# Patient Record
Sex: Female | Born: 1955 | Race: Black or African American | Hispanic: No | Marital: Married | Smoking: Former smoker
Health system: Southern US, Community
[De-identification: ages and names within clinical notes are randomized; demographics above are authoritative.]

## PROBLEM LIST (undated history)

## (undated) DIAGNOSIS — R011 Cardiac murmur, unspecified: Secondary | ICD-10-CM

## (undated) DIAGNOSIS — H269 Unspecified cataract: Secondary | ICD-10-CM

## (undated) DIAGNOSIS — J302 Other seasonal allergic rhinitis: Secondary | ICD-10-CM

## (undated) DIAGNOSIS — E785 Hyperlipidemia, unspecified: Secondary | ICD-10-CM

## (undated) DIAGNOSIS — H35039 Hypertensive retinopathy, unspecified eye: Secondary | ICD-10-CM

## (undated) DIAGNOSIS — G4730 Sleep apnea, unspecified: Secondary | ICD-10-CM

## (undated) DIAGNOSIS — K219 Gastro-esophageal reflux disease without esophagitis: Secondary | ICD-10-CM

## (undated) DIAGNOSIS — I10 Essential (primary) hypertension: Secondary | ICD-10-CM

## (undated) DIAGNOSIS — E119 Type 2 diabetes mellitus without complications: Secondary | ICD-10-CM

## (undated) HISTORY — DX: Type 2 diabetes mellitus without complications: E11.9

## (undated) HISTORY — PX: COLONOSCOPY: SHX174

## (undated) HISTORY — PX: WISDOM TOOTH EXTRACTION: SHX21

## (undated) HISTORY — DX: Hypertensive retinopathy, unspecified eye: H35.039

## (undated) HISTORY — DX: Unspecified cataract: H26.9

## (undated) HISTORY — PX: SHOULDER SURGERY: SHX246

---

## 1999-02-24 ENCOUNTER — Other Ambulatory Visit: Admission: RE | Admit: 1999-02-24 | Discharge: 1999-02-24 | Payer: Self-pay | Admitting: Gynecology

## 1999-07-21 ENCOUNTER — Encounter: Payer: Self-pay | Admitting: Family Medicine

## 1999-07-21 ENCOUNTER — Encounter: Admission: RE | Admit: 1999-07-21 | Discharge: 1999-07-21 | Payer: Self-pay | Admitting: Family Medicine

## 2000-06-18 ENCOUNTER — Encounter: Admission: RE | Admit: 2000-06-18 | Discharge: 2000-06-18 | Payer: Self-pay | Admitting: Family Medicine

## 2000-06-18 ENCOUNTER — Encounter: Payer: Self-pay | Admitting: Family Medicine

## 2000-06-25 ENCOUNTER — Encounter: Admission: RE | Admit: 2000-06-25 | Discharge: 2000-06-25 | Payer: Self-pay | Admitting: Family Medicine

## 2000-06-25 ENCOUNTER — Encounter: Payer: Self-pay | Admitting: Family Medicine

## 2001-06-28 ENCOUNTER — Encounter: Payer: Self-pay | Admitting: Family Medicine

## 2001-06-28 ENCOUNTER — Encounter: Admission: RE | Admit: 2001-06-28 | Discharge: 2001-06-28 | Payer: Self-pay | Admitting: Family Medicine

## 2001-09-06 ENCOUNTER — Other Ambulatory Visit: Admission: RE | Admit: 2001-09-06 | Discharge: 2001-09-06 | Payer: Self-pay | Admitting: Family Medicine

## 2002-07-05 ENCOUNTER — Encounter: Payer: Self-pay | Admitting: Family Medicine

## 2002-07-05 ENCOUNTER — Encounter: Admission: RE | Admit: 2002-07-05 | Discharge: 2002-07-05 | Payer: Self-pay | Admitting: Family Medicine

## 2002-07-17 ENCOUNTER — Encounter: Admission: RE | Admit: 2002-07-17 | Discharge: 2002-07-17 | Payer: Self-pay | Admitting: Family Medicine

## 2002-07-17 ENCOUNTER — Encounter: Payer: Self-pay | Admitting: Family Medicine

## 2003-07-09 ENCOUNTER — Encounter: Admission: RE | Admit: 2003-07-09 | Discharge: 2003-07-09 | Payer: Self-pay | Admitting: Occupational Medicine

## 2004-03-03 ENCOUNTER — Other Ambulatory Visit: Admission: RE | Admit: 2004-03-03 | Discharge: 2004-03-03 | Payer: Self-pay | Admitting: Family Medicine

## 2004-09-26 ENCOUNTER — Encounter: Admission: RE | Admit: 2004-09-26 | Discharge: 2004-09-26 | Payer: Self-pay | Admitting: Occupational Medicine

## 2005-09-28 ENCOUNTER — Encounter: Admission: RE | Admit: 2005-09-28 | Discharge: 2005-09-28 | Payer: Self-pay | Admitting: Family Medicine

## 2005-10-30 ENCOUNTER — Other Ambulatory Visit: Admission: RE | Admit: 2005-10-30 | Discharge: 2005-10-30 | Payer: Self-pay | Admitting: Family Medicine

## 2006-11-15 ENCOUNTER — Encounter: Admission: RE | Admit: 2006-11-15 | Discharge: 2006-11-15 | Payer: Self-pay | Admitting: Family Medicine

## 2006-12-28 ENCOUNTER — Other Ambulatory Visit: Admission: RE | Admit: 2006-12-28 | Discharge: 2006-12-28 | Payer: Self-pay | Admitting: Family Medicine

## 2007-11-28 ENCOUNTER — Encounter: Admission: RE | Admit: 2007-11-28 | Discharge: 2007-11-28 | Payer: Self-pay | Admitting: Family Medicine

## 2008-01-26 ENCOUNTER — Other Ambulatory Visit: Admission: RE | Admit: 2008-01-26 | Discharge: 2008-01-26 | Payer: Self-pay | Admitting: Family Medicine

## 2008-12-04 ENCOUNTER — Encounter: Admission: RE | Admit: 2008-12-04 | Discharge: 2008-12-04 | Payer: Self-pay | Admitting: Family Medicine

## 2009-02-20 ENCOUNTER — Other Ambulatory Visit: Admission: RE | Admit: 2009-02-20 | Discharge: 2009-02-20 | Payer: Self-pay | Admitting: Family Medicine

## 2009-12-05 ENCOUNTER — Encounter: Admission: RE | Admit: 2009-12-05 | Discharge: 2009-12-05 | Payer: Self-pay | Admitting: Family Medicine

## 2010-02-24 ENCOUNTER — Other Ambulatory Visit: Admission: RE | Admit: 2010-02-24 | Discharge: 2010-02-24 | Payer: Self-pay | Admitting: Family Medicine

## 2010-08-21 ENCOUNTER — Ambulatory Visit (HOSPITAL_COMMUNITY): Payer: Self-pay

## 2010-11-11 ENCOUNTER — Other Ambulatory Visit: Payer: Self-pay | Admitting: Family Medicine

## 2010-11-11 DIAGNOSIS — Z1231 Encounter for screening mammogram for malignant neoplasm of breast: Secondary | ICD-10-CM

## 2010-12-08 ENCOUNTER — Ambulatory Visit
Admission: RE | Admit: 2010-12-08 | Discharge: 2010-12-08 | Disposition: A | Discharge status: Home / Self Care | Payer: Federal, State, Local not specified - PPO | Source: Ambulatory Visit | Attending: Family Medicine | Admitting: Family Medicine

## 2010-12-08 DIAGNOSIS — Z1231 Encounter for screening mammogram for malignant neoplasm of breast: Secondary | ICD-10-CM

## 2011-12-22 ENCOUNTER — Other Ambulatory Visit: Payer: Self-pay | Admitting: Family Medicine

## 2011-12-22 DIAGNOSIS — Z1231 Encounter for screening mammogram for malignant neoplasm of breast: Secondary | ICD-10-CM

## 2012-01-19 ENCOUNTER — Ambulatory Visit
Admission: RE | Admit: 2012-01-19 | Discharge: 2012-01-19 | Disposition: A | Discharge status: Home / Self Care | Payer: Federal, State, Local not specified - PPO | Source: Ambulatory Visit | Attending: Family Medicine | Admitting: Family Medicine

## 2012-01-19 DIAGNOSIS — Z1231 Encounter for screening mammogram for malignant neoplasm of breast: Secondary | ICD-10-CM

## 2012-03-29 ENCOUNTER — Other Ambulatory Visit: Payer: Self-pay | Admitting: Family Medicine

## 2012-03-29 ENCOUNTER — Other Ambulatory Visit (HOSPITAL_COMMUNITY)
Admission: RE | Admit: 2012-03-29 | Discharge: 2012-03-29 | Disposition: A | Discharge status: Home / Self Care | Payer: Federal, State, Local not specified - PPO | Source: Ambulatory Visit | Attending: Family Medicine | Admitting: Family Medicine

## 2012-03-29 DIAGNOSIS — Z124 Encounter for screening for malignant neoplasm of cervix: Secondary | ICD-10-CM | POA: Insufficient documentation

## 2012-03-29 DIAGNOSIS — Z1151 Encounter for screening for human papillomavirus (HPV): Secondary | ICD-10-CM | POA: Insufficient documentation

## 2013-01-06 ENCOUNTER — Other Ambulatory Visit: Payer: Self-pay

## 2013-01-06 DIAGNOSIS — Z1231 Encounter for screening mammogram for malignant neoplasm of breast: Secondary | ICD-10-CM

## 2013-02-03 ENCOUNTER — Ambulatory Visit
Admission: RE | Admit: 2013-02-03 | Discharge: 2013-02-03 | Disposition: A | Discharge status: Home / Self Care | Payer: Federal, State, Local not specified - PPO | Source: Ambulatory Visit

## 2013-02-03 DIAGNOSIS — Z1231 Encounter for screening mammogram for malignant neoplasm of breast: Secondary | ICD-10-CM

## 2014-01-08 ENCOUNTER — Other Ambulatory Visit: Payer: Self-pay

## 2014-01-08 DIAGNOSIS — Z1231 Encounter for screening mammogram for malignant neoplasm of breast: Secondary | ICD-10-CM

## 2014-02-05 ENCOUNTER — Ambulatory Visit
Admission: RE | Admit: 2014-02-05 | Discharge: 2014-02-05 | Disposition: A | Discharge status: Home / Self Care | Payer: Federal, State, Local not specified - PPO | Source: Ambulatory Visit

## 2014-02-05 DIAGNOSIS — Z1231 Encounter for screening mammogram for malignant neoplasm of breast: Secondary | ICD-10-CM

## 2014-03-07 ENCOUNTER — Ambulatory Visit: Payer: Federal, State, Local not specified - PPO | Attending: Family Medicine

## 2014-03-07 DIAGNOSIS — Z5189 Encounter for other specified aftercare: Secondary | ICD-10-CM | POA: Diagnosis present

## 2014-03-07 DIAGNOSIS — M25511 Pain in right shoulder: Secondary | ICD-10-CM | POA: Diagnosis not present

## 2014-03-07 DIAGNOSIS — I10 Essential (primary) hypertension: Secondary | ICD-10-CM | POA: Insufficient documentation

## 2014-03-07 DIAGNOSIS — M65311 Trigger thumb, right thumb: Secondary | ICD-10-CM | POA: Insufficient documentation

## 2014-03-07 DIAGNOSIS — R5381 Other malaise: Secondary | ICD-10-CM | POA: Insufficient documentation

## 2014-03-09 ENCOUNTER — Ambulatory Visit: Payer: Federal, State, Local not specified - PPO | Admitting: Physical Therapy

## 2014-03-09 DIAGNOSIS — Z5189 Encounter for other specified aftercare: Secondary | ICD-10-CM | POA: Diagnosis not present

## 2014-03-12 ENCOUNTER — Ambulatory Visit: Payer: Federal, State, Local not specified - PPO | Attending: Family Medicine | Admitting: Physical Therapy

## 2014-03-12 DIAGNOSIS — M25511 Pain in right shoulder: Secondary | ICD-10-CM | POA: Diagnosis not present

## 2014-03-12 DIAGNOSIS — M65311 Trigger thumb, right thumb: Secondary | ICD-10-CM | POA: Diagnosis not present

## 2014-03-12 DIAGNOSIS — R5381 Other malaise: Secondary | ICD-10-CM | POA: Diagnosis not present

## 2014-03-12 DIAGNOSIS — I10 Essential (primary) hypertension: Secondary | ICD-10-CM | POA: Diagnosis not present

## 2014-03-12 DIAGNOSIS — Z5189 Encounter for other specified aftercare: Secondary | ICD-10-CM | POA: Insufficient documentation

## 2014-03-14 ENCOUNTER — Ambulatory Visit: Payer: Federal, State, Local not specified - PPO | Admitting: Physical Therapy

## 2014-03-14 DIAGNOSIS — Z5189 Encounter for other specified aftercare: Secondary | ICD-10-CM | POA: Diagnosis not present

## 2014-03-19 ENCOUNTER — Ambulatory Visit: Payer: Federal, State, Local not specified - PPO | Admitting: Physical Therapy

## 2014-03-19 DIAGNOSIS — Z5189 Encounter for other specified aftercare: Secondary | ICD-10-CM | POA: Diagnosis not present

## 2014-03-21 ENCOUNTER — Ambulatory Visit: Payer: Federal, State, Local not specified - PPO | Admitting: Physical Therapy

## 2014-03-21 DIAGNOSIS — Z5189 Encounter for other specified aftercare: Secondary | ICD-10-CM | POA: Diagnosis not present

## 2014-04-04 ENCOUNTER — Ambulatory Visit: Payer: Federal, State, Local not specified - PPO | Admitting: Physical Therapy

## 2014-04-04 DIAGNOSIS — Z5189 Encounter for other specified aftercare: Secondary | ICD-10-CM | POA: Diagnosis not present

## 2015-01-17 ENCOUNTER — Other Ambulatory Visit: Payer: Self-pay

## 2015-01-17 DIAGNOSIS — Z1231 Encounter for screening mammogram for malignant neoplasm of breast: Secondary | ICD-10-CM

## 2015-02-08 ENCOUNTER — Ambulatory Visit
Admission: RE | Admit: 2015-02-08 | Discharge: 2015-02-08 | Disposition: A | Discharge status: Home / Self Care | Payer: Federal, State, Local not specified - PPO | Source: Ambulatory Visit

## 2015-02-08 DIAGNOSIS — Z1231 Encounter for screening mammogram for malignant neoplasm of breast: Secondary | ICD-10-CM

## 2015-05-01 ENCOUNTER — Other Ambulatory Visit: Payer: Self-pay | Admitting: Family Medicine

## 2015-05-01 ENCOUNTER — Other Ambulatory Visit (HOSPITAL_COMMUNITY)
Admission: RE | Admit: 2015-05-01 | Discharge: 2015-05-01 | Disposition: A | Discharge status: Home / Self Care | Payer: Federal, State, Local not specified - PPO | Source: Ambulatory Visit | Attending: Family Medicine | Admitting: Family Medicine

## 2015-05-01 DIAGNOSIS — Z01411 Encounter for gynecological examination (general) (routine) with abnormal findings: Secondary | ICD-10-CM | POA: Diagnosis present

## 2015-05-01 DIAGNOSIS — Z1151 Encounter for screening for human papillomavirus (HPV): Secondary | ICD-10-CM | POA: Diagnosis present

## 2015-05-03 LAB — CYTOLOGY - PAP

## 2015-08-27 DIAGNOSIS — K080 Exfoliation of teeth due to systemic causes: Secondary | ICD-10-CM | POA: Diagnosis not present

## 2015-12-03 DIAGNOSIS — Z1211 Encounter for screening for malignant neoplasm of colon: Secondary | ICD-10-CM | POA: Diagnosis not present

## 2016-01-07 DIAGNOSIS — L659 Nonscarring hair loss, unspecified: Secondary | ICD-10-CM | POA: Diagnosis not present

## 2016-01-07 DIAGNOSIS — L810 Postinflammatory hyperpigmentation: Secondary | ICD-10-CM | POA: Diagnosis not present

## 2016-01-17 ENCOUNTER — Other Ambulatory Visit: Payer: Self-pay | Admitting: Family Medicine

## 2016-01-17 DIAGNOSIS — Z1231 Encounter for screening mammogram for malignant neoplasm of breast: Secondary | ICD-10-CM

## 2016-02-12 ENCOUNTER — Ambulatory Visit
Admission: RE | Admit: 2016-02-12 | Discharge: 2016-02-12 | Disposition: A | Discharge status: Home / Self Care | Payer: Federal, State, Local not specified - PPO | Source: Ambulatory Visit | Attending: Family Medicine | Admitting: Family Medicine

## 2016-02-12 DIAGNOSIS — Z1231 Encounter for screening mammogram for malignant neoplasm of breast: Secondary | ICD-10-CM | POA: Diagnosis not present

## 2016-02-17 DIAGNOSIS — T753XXA Motion sickness, initial encounter: Secondary | ICD-10-CM | POA: Diagnosis not present

## 2016-02-17 DIAGNOSIS — I10 Essential (primary) hypertension: Secondary | ICD-10-CM | POA: Diagnosis not present

## 2016-02-17 DIAGNOSIS — J309 Allergic rhinitis, unspecified: Secondary | ICD-10-CM | POA: Diagnosis not present

## 2016-02-17 DIAGNOSIS — G4733 Obstructive sleep apnea (adult) (pediatric): Secondary | ICD-10-CM | POA: Diagnosis not present

## 2016-02-19 DIAGNOSIS — K080 Exfoliation of teeth due to systemic causes: Secondary | ICD-10-CM | POA: Diagnosis not present

## 2016-02-27 DIAGNOSIS — G4733 Obstructive sleep apnea (adult) (pediatric): Secondary | ICD-10-CM | POA: Diagnosis not present

## 2016-03-30 DIAGNOSIS — G4733 Obstructive sleep apnea (adult) (pediatric): Secondary | ICD-10-CM | POA: Diagnosis not present

## 2016-04-13 DIAGNOSIS — L669 Cicatricial alopecia, unspecified: Secondary | ICD-10-CM | POA: Diagnosis not present

## 2016-04-13 DIAGNOSIS — L738 Other specified follicular disorders: Secondary | ICD-10-CM | POA: Diagnosis not present

## 2016-04-29 DIAGNOSIS — G4733 Obstructive sleep apnea (adult) (pediatric): Secondary | ICD-10-CM | POA: Diagnosis not present

## 2016-05-08 DIAGNOSIS — E785 Hyperlipidemia, unspecified: Secondary | ICD-10-CM | POA: Diagnosis not present

## 2016-05-08 DIAGNOSIS — Z0000 Encounter for general adult medical examination without abnormal findings: Secondary | ICD-10-CM | POA: Diagnosis not present

## 2016-05-30 DIAGNOSIS — G4733 Obstructive sleep apnea (adult) (pediatric): Secondary | ICD-10-CM | POA: Diagnosis not present

## 2016-06-23 DIAGNOSIS — G4733 Obstructive sleep apnea (adult) (pediatric): Secondary | ICD-10-CM | POA: Diagnosis not present

## 2016-06-30 DIAGNOSIS — G4733 Obstructive sleep apnea (adult) (pediatric): Secondary | ICD-10-CM | POA: Diagnosis not present

## 2016-08-11 DIAGNOSIS — M545 Low back pain: Secondary | ICD-10-CM | POA: Diagnosis not present

## 2016-08-31 DIAGNOSIS — K080 Exfoliation of teeth due to systemic causes: Secondary | ICD-10-CM | POA: Diagnosis not present

## 2016-11-02 DIAGNOSIS — E7800 Pure hypercholesterolemia, unspecified: Secondary | ICD-10-CM | POA: Diagnosis not present

## 2016-11-02 DIAGNOSIS — M545 Low back pain: Secondary | ICD-10-CM | POA: Diagnosis not present

## 2016-12-01 ENCOUNTER — Ambulatory Visit: Payer: Federal, State, Local not specified - PPO | Attending: Family Medicine | Admitting: Physical Therapy

## 2016-12-01 DIAGNOSIS — M6281 Muscle weakness (generalized): Secondary | ICD-10-CM | POA: Diagnosis not present

## 2016-12-01 DIAGNOSIS — G8929 Other chronic pain: Secondary | ICD-10-CM | POA: Insufficient documentation

## 2016-12-01 DIAGNOSIS — M545 Low back pain: Secondary | ICD-10-CM | POA: Diagnosis not present

## 2016-12-01 NOTE — Therapy (Signed)
[facility] [address], [address] [place], [place], [address] Phone: [phone]   Fax:  [phone]  Physical Therapy Evaluation  Patient Details  Name: Teresa Fernandez MRN: [id number] Date of Birth: [birth year] Referring Provider: Dibas Koirala  Encounter Date: 12/01/2016      PT End of Session - 12/01/16 1658    Visit Number 1   Date for PT Re-Evaluation 01/26/17   PT Start Time 1615   PT Stop Time 1700   PT Time Calculation (min) 45 min   Activity Tolerance Patient tolerated treatment well      No past medical history on file.  No past surgical history on file.  There were no vitals filed for this visit.       Subjective Assessment - 12/01/16 1619    Subjective Pain in low back several months ago for no apparent reason (September "17);  LBP at night and hurts with turning;  also with prolonged standing;  stopped going to the gym b/c afraid it might aggravate    Pertinent History HTN;  just got back from [place];  right shoulder surgery   Limitations House hold activities;Standing   How long can you sit comfortably? depends on what I'm sitting on  (need good support)   How long can you walk comfortably? it depends;  I don't do as much since back has been bothering   Diagnostic tests going to be scheduled for x-ray   Patient Stated Goals not have back pain or at least manage it   Currently in Pain? Yes   Pain Location Back   Pain Orientation Lower;Right   Pain Type Chronic pain   Pain Radiating Towards right LE achiness   Pain Onset More than a month ago   Pain Frequency Intermittent   Aggravating Factors  sit to stand;night; standing   Pain Relieving Factors lie on the opposite             [facility] PT Assessment - 12/01/16 0001      Assessment   Medical Diagnosis low back pain without sciatica   Referring Provider Dibas Koirala   Onset Date/Surgical Date --  Sept '17   Next MD Visit not scheduled    Prior Therapy for shoulder     Precautions   Precautions None     Restrictions   Weight Bearing Restrictions No     Balance Screen   Has the patient fallen in the past 6 months No   Has the patient had a decrease in activity level because of a fear of falling?  No   Is the patient reluctant to leave their home because of a fear of falling?  No     Home Environment   Living Environment Private residence   Living Arrangements Spouse/significant other   Home Access Stairs to enter     Prior Function   Level of Independence Independent   Vocation Full time employment   Vocation Requirements sit at computer   Leisure watch movies, travel, go for walks     Observation/Other Assessments   Focus on Therapeutic Outcomes (FOTO)  28% limitation     Posture/Postural Control   Posture/Postural Control No significant limitations     AROM   Lumbar Flexion 70   Lumbar Extension 20   Lumbar - Right Side Bend 22   Lumbar - Left Side Bend 32     Strength   Strength Assessment Site --  difficulty activating transverse abdominals  Right/Left Hip --  difficulty stabilizing with quadruped arm/leg lift   Right Hip ABduction 4+/5   Left Hip ABduction 5/5   Lumbar Flexion 4-/5   Lumbar Extension 4-/5     Flexibility   Hamstrings 90 degrees bil     Palpation   Palpation comment no tenderness      Slump test   Findings Negative     Prone Knee Bend Test   Findings Negative     Straight Leg Raise   Findings Negative            Objective measurements completed on examination: See above findings.                  PT Education - 12/01/16 1655    Education provided Yes   Education Details press ups;  abdominal brace;  sleeping posture   Person(s) Educated Patient   Methods Explanation;Demonstration;Handout   Comprehension Verbalized understanding;Returned demonstration          PT Short Term Goals - 12/01/16 1717      PT SHORT TERM GOAL #1   Title  The patient will express understanding of initial self management techniques including positioning for sleep  12/29/16   Time 4   Period Weeks   Status New   Target Date 12/29/16     PT SHORT TERM GOAL #2   Title The patient will report a 25% improvement in sleep   Time 4   Period Weeks   Status New   Target Date 12/29/16     PT SHORT TERM GOAL #3   Title The patient will have improved lumbar extension ROM to 25 degrees and right sidebending to 30 degrees needed for greater ease turning in bed   Time 4   Period Weeks   Status New   Target Date 12/29/16           PT Long Term Goals - 12/01/16 1720      PT LONG TERM GOAL #1   Title The patient will be independent in safe self progression of HEP and return to gym program     Time 8   Period Weeks   Status New   Target Date 01/26/17     PT LONG TERM GOAL #2   Title The patient will report a 60% improvement in sleep    Time 8   Period Weeks   Status New   Target Date 01/26/17     PT LONG TERM GOAL #3   Title The patient will be able to stand/walk for 1 hour with minimal to no increase in pain   Time 8   Period Weeks   Status New   Target Date 01/26/17     PT LONG TERM GOAL #4   Title Abdominal, trunk extensor and hip abductor strength grossly 4+/5 to 5-/5 needed for standing longer periods of time    Time 8   Period Weeks   Status New   Target Date 01/26/17     PT LONG TERM GOAL #5   Title FOTO functional outcome score improved from 28% to 23% indicating improved function with less pain   Time 8   Period Weeks   Status New   Target Date 01/26/17                Plan - 12/01/16 1710    Clinical Impression Statement The patient has had right low back pain with intermittent right LE pain since September for no  apparent reason.  The pain is the worst at night especially with turning in bed.  She also will have increased pain with prolonged standing and she has discontinued her gym program in response to  her back pain.  Her lumbar ROM is WNLS except decreased lumbar extension and right sidebending.  Decreased transverse abdominus muscle activation as well as lumbar multifidi.  Decreased right gluteal strength 4+/5.  Normal HS muscle length.  She would benefit from PT to address these deficits.     History and Personal Factors relevant to plan of care: minimal co-morbidities, good home support   Clinical Presentation Stable   Clinical Presentation due to: right low back pain   Clinical Decision Making Low   Rehab Potential Good   Clinical Impairments Affecting Rehab Potential none   PT Frequency 2x / week   PT Duration 8 weeks   PT Treatment/Interventions ADLs/Self Care Home Management;Electrical Stimulation;Cryotherapy;Moist Heat;Traction;Therapeutic activities;Therapeutic exercise;Neuromuscular re-education;Patient/family education;Taping;Dry needling   PT Next Visit Plan review sleeping positions and log rolling;  review abdominal brace and add progression;  assess response to press ups, add lumbar multifidi activation ex;  core stabilization;  electrical stimulation as needed for pain   Consulted and Agree with Plan of Care Patient      Patient will benefit from skilled therapeutic intervention in order to improve the following deficits and impairments:  Pain, Decreased range of motion, Decreased strength, Postural dysfunction  Visit Diagnosis: Chronic right-sided low back pain without sciatica - Plan: PT plan of care cert/re-cert  Muscle weakness (generalized) - Plan: PT plan of care cert/re-cert     Problem List There are no active problems to display for this patient.  Lavinia Sharps, PT 12/01/16 5:26 PM Phone: [phone] Fax: [phone]  Vivien Presto 12/01/2016, 5:26 PM  [facility] [address], [address] [place], [place], [address] Phone: [phone]   Fax:  [phone]  Name: NIKIYAH FACKLER MRN:  [id number] Date of Birth: 05-09-1956

## 2016-12-01 NOTE — Patient Instructions (Signed)

## 2016-12-03 ENCOUNTER — Encounter: Payer: Self-pay | Admitting: Physical Therapy

## 2016-12-03 ENCOUNTER — Ambulatory Visit: Payer: Federal, State, Local not specified - PPO | Admitting: Physical Therapy

## 2016-12-03 DIAGNOSIS — M6281 Muscle weakness (generalized): Secondary | ICD-10-CM

## 2016-12-03 DIAGNOSIS — M545 Low back pain: Secondary | ICD-10-CM | POA: Diagnosis not present

## 2016-12-03 DIAGNOSIS — G8929 Other chronic pain: Secondary | ICD-10-CM

## 2016-12-03 NOTE — Therapy (Signed)
[facility] [address], [address] [place], [place], [address] Phone: [phone]   Fax:  [phone]  Physical Therapy Treatment  Patient Details  Name: Teresa Fernandez MRN: [id number] Date of Birth: 05-27-1955 Referring Provider: Dibas Koirala  Encounter Date: 12/03/2016      PT End of Session - 12/03/16 1534    Visit Number 2   Date for PT Re-Evaluation 01/26/17   PT Start Time 1533   PT Stop Time 1626   PT Time Calculation (min) 53 min   Activity Tolerance Patient tolerated treatment well      History reviewed. No pertinent past medical history.  History reviewed. No pertinent surgical history.  There were no vitals filed for this visit.      Subjective Assessment - 12/03/16 1537    Subjective It doesn't hurt all the time just when I bend certain ways.  No problem with the press ups but my arms are sore.   Pertinent History HTN;  [place];  [place]   Limitations House hold activities;Standing   How long can you sit comfortably? depends on what I'm sitting on  (need good support)   How long can you walk comfortably? it depends;  I don't do as much since back has been bothering   Diagnostic tests going to be scheduled for x-ray   Patient Stated Goals not have back pain or at least manage it   Currently in Pain? No/denies                         [facility] Adult PT Treatment/Exercise - 12/03/16 0001      Neuro Re-ed    Neuro Re-ed Details  verbal and tactile cues and training TrA while keeping rectus abs relaxed     Exercises   Exercises Lumbar     Lumbar Exercises: Stretches   Active Hamstring Stretch 3 reps;30 seconds     Lumbar Exercises: Standing   Other Standing Lumbar Exercises UE press down for isometric TrA contraction   Other Standing Lumbar Exercises lumbar extension     Lumbar Exercises: Supine   Ab Set 20 reps  with large red ball     Lumbar  Exercises: Prone   Straight Leg Raise 10 reps   Other Prone Lumbar Exercises prone press ups  reduced pain     Modalities   Modalities Cryotherapy;Electrical Stimulation     Cryotherapy   Number Minutes Cryotherapy 15 Minutes   Cryotherapy Location Lumbar Spine   Type of Cryotherapy Ice pack     Electrical Stimulation   Electrical Stimulation Location lumbar   Electrical Stimulation Action IFC   Electrical Stimulation Parameters 80-150 MHz   Electrical Stimulation Goals Pain                  PT Short Term Goals - 12/03/16 1541      PT SHORT TERM GOAL #1   Title The patient will express understanding of initial self management techniques including positioning for sleep  12/29/16   Time 4   Period Weeks   Status On-going     PT SHORT TERM GOAL #2   Title The patient will report a 25% improvement in sleep   Time 4   Period Weeks   Status On-going     PT SHORT TERM GOAL #3   Title The patient will have improved lumbar extension ROM to 25 degrees and right  sidebending to 30 degrees needed for greater ease turning in bed   Time 4   Period Weeks   Status On-going           PT Long Term Goals - 12/01/16 1720      PT LONG TERM GOAL #1   Title The patient will be independent in safe self progression of HEP and return to gym program     Time 8   Period Weeks   Status New   Target Date 01/26/17     PT LONG TERM GOAL #2   Title The patient will report a 60% improvement in sleep    Time 8   Period Weeks   Status New   Target Date 01/26/17     PT LONG TERM GOAL #3   Title The patient will be able to stand/walk for 1 hour with minimal to no increase in pain   Time 8   Period Weeks   Status New   Target Date 01/26/17     PT LONG TERM GOAL #4   Title Abdominal, trunk extensor and hip abductor strength grossly 4+/5 to 5-/5 needed for standing longer periods of time    Time 8   Period Weeks   Status New   Target Date 01/26/17     PT LONG TERM GOAL #5    Title FOTO functional outcome score improved from 28% to 23% indicating improved function with less pain   Time 8   Period Weeks   Status New   Target Date 01/26/17               Plan - 12/03/16 1544    Clinical Impression Statement Only one treatment since eval but patient already notices improved sleep due to rolling over with log roll.  Pt needs cues throughout to become more aware of not over using rectus abdominis.  Has a lot of difficulty turning off rectus abdominis.  Pt needs skilled PT to ensure correct muscle coordination of abdominals for safe performance of functional activities such as sitting at work.   PT Treatment/Interventions ADLs/Self Care Home Management;Electrical Stimulation;Cryotherapy;Moist Heat;Traction;Therapeutic activities;Therapeutic exercise;Neuromuscular re-education;Patient/family education;Taping;Dry needling   PT Next Visit Plan abdominal bracing progressions, multifidi, posutre   Consulted and Agree with Plan of Care Patient      Patient will benefit from skilled therapeutic intervention in order to improve the following deficits and impairments:  Pain, Decreased range of motion, Decreased strength, Postural dysfunction  Visit Diagnosis: Chronic right-sided low back pain without sciatica  Muscle weakness (generalized)     Problem List There are no active problems to display for this patient.   Vincente PoliJakki Crosser, PT 12/03/2016, 4:19 PM  [facility] [address], [address] [place], [place], [address] Phone: [phone]   Fax:  [phone]  Name: Teresa Fernandez MRN: [id number] Date of Birth: 1956/03/06

## 2016-12-08 ENCOUNTER — Ambulatory Visit: Payer: Federal, State, Local not specified - PPO

## 2016-12-08 DIAGNOSIS — M545 Low back pain: Principal | ICD-10-CM

## 2016-12-08 DIAGNOSIS — G8929 Other chronic pain: Secondary | ICD-10-CM | POA: Diagnosis not present

## 2016-12-08 DIAGNOSIS — M6281 Muscle weakness (generalized): Secondary | ICD-10-CM

## 2016-12-08 NOTE — Therapy (Signed)
[facility] [address], [address] [place], [place], [address] Phone: [phone]   Fax:  [phone]  Physical Therapy Treatment  Patient Details  Name: Teresa Fernandez MRN: [id number] Date of Birth: 08-Oct-1955 Referring Provider: Dibas Koirala  Encounter Date: 12/08/2016      PT End of Session - 12/08/16 1625    Visit Number 3   Date for PT Re-Evaluation 01/26/17   PT Start Time 1617   PT Stop Time 1658   PT Time Calculation (min) 41 min   Activity Tolerance Patient tolerated treatment well      No past medical history on file.  No past surgical history on file.  There were no vitals filed for this visit.      Subjective Assessment - 12/08/16 1621    Subjective Pt. noting she has been sleeping on side however does not use pillow because this bothers her while positioning with CPAP machine.     Patient Stated Goals not have back pain or at least manage it   Currently in Pain? No/denies   Multiple Pain Sites No                         [facility] Adult PT Treatment/Exercise - 12/08/16 1633      Self-Care   Self-Care Other Self-Care Comments   Other Self-Care Comments  Further discussion of proper sitting posture at work, strategies to reduce lumbar strain with kitchen work and household chores; pt. verbalizing good understanding of this and reports use of these strategies     Lumbar Exercises: Aerobic   Stationary Bike NuStep: lvl 3, 8 min      Lumbar Exercises: Standing   Other Standing Lumbar Exercises UE press down for isometric TrA contraction; pain free   Other Standing Lumbar Exercises lumbar extension x 10 reps; pain free     Lumbar Exercises: Supine   Ab Set 15 reps;5 seconds  5" hold; tactile cues    Bent Knee Raise 10 reps;3 seconds   Bent Knee Raise Limitations with abdominal contraction   Bridge 15 reps;3 seconds   Isometric Hip Flexion 5 seconds;10 reps                   PT Short Term Goals - 12/03/16 1541      PT SHORT TERM GOAL #1   Title The patient will express understanding of initial self management techniques including positioning for sleep  12/29/16   Time 4   Period Weeks   Status On-going     PT SHORT TERM GOAL #2   Title The patient will report a 25% improvement in sleep   Time 4   Period Weeks   Status On-going     PT SHORT TERM GOAL #3   Title The patient will have improved lumbar extension ROM to 25 degrees and right sidebending to 30 degrees needed for greater ease turning in bed   Time 4   Period Weeks   Status On-going           PT Long Term Goals - 12/08/16 1625      PT LONG TERM GOAL #1   Title The patient will be independent in safe self progression of HEP and return to gym program     Time 8   Period Weeks   Status On-going     PT LONG TERM GOAL #2   Title The patient will report a 60%  improvement in sleep    Time 8   Period Weeks   Status On-going     PT LONG TERM GOAL #3   Title The patient will be able to stand/walk for 1 hour with minimal to no increase in pain   Time 8   Period Weeks   Status On-going     PT LONG TERM GOAL #4   Title Abdominal, trunk extensor and hip abductor strength grossly 4+/5 to 5-/5 needed for standing longer periods of time    Time 8   Period Weeks   Status On-going     PT LONG TERM GOAL #5   Title FOTO functional outcome score improved from 28% to 23% indicating improved function with less pain   Time 8   Period Weeks   Status On-going               Plan - 12/08/16 1626    Clinical Impression Statement Pt. noting she is more conscious of sitting posture and desk setup at work and has been performing HEP activities daily.  Notes increased LBP following prone press up at night before bed however unable to reproduce any of this pain in treatment with standing or prone press up extension.  Pt. tolerated progression of lumbopelvic strengthening  activities in treatment well with only slight short-lasting pain with LTR.  Demonstrates good understanding of log rolling to reduce lumbar strain with supine<>sit.    PT Treatment/Interventions ADLs/Self Care Home Management;Electrical Stimulation;Cryotherapy;Moist Heat;Traction;Therapeutic activities;Therapeutic exercise;Neuromuscular re-education;Patient/family education;Taping;Dry needling   PT Next Visit Plan abdominal bracing progressions, multifidi, posutre      Patient will benefit from skilled therapeutic intervention in order to improve the following deficits and impairments:  Pain, Decreased range of motion, Decreased strength, Postural dysfunction  Visit Diagnosis: Chronic right-sided low back pain without sciatica  Muscle weakness (generalized)     Problem List There are no active problems to display for this patient.   Kermit BaloMicah Shondrika Hoque, PTA  12/08/16 5:11 PM   [facility] [address], [address] [place], [place], [address] Phone: [phone]   Fax:  [phone]  Name: Teresa Fernandez MRN: [id number] Date of Birth: [birth year]

## 2016-12-14 ENCOUNTER — Encounter: Payer: Federal, State, Local not specified - PPO | Admitting: Rehabilitative and Restorative Service Providers"

## 2016-12-17 ENCOUNTER — Ambulatory Visit: Payer: Federal, State, Local not specified - PPO | Attending: Family Medicine | Admitting: Physical Therapy

## 2016-12-17 DIAGNOSIS — M6281 Muscle weakness (generalized): Secondary | ICD-10-CM | POA: Insufficient documentation

## 2016-12-17 DIAGNOSIS — M545 Low back pain: Secondary | ICD-10-CM | POA: Diagnosis not present

## 2016-12-17 DIAGNOSIS — G8929 Other chronic pain: Secondary | ICD-10-CM | POA: Diagnosis not present

## 2016-12-17 NOTE — Therapy (Signed)
[facility] [address], [address] [place], [place], [address] Phone: [phone]   Fax:  [phone]  Physical Therapy Treatment  Patient Details  Name: Teresa Fernandez MRN: [id number] Date of Birth: [birth year] Referring Provider: Dibas Koirala  Encounter Date: 12/17/2016      PT End of Session - 12/17/16 0903    Visit Number 4   Date for PT Re-Evaluation 01/26/17   PT Start Time 0850   PT Stop Time 0930   PT Time Calculation (min) 40 min   Activity Tolerance Patient tolerated treatment well      No past medical history on file.  No past surgical history on file.  There were no vitals filed for this visit.      Subjective Assessment - 12/17/16 0851    Subjective My back is about the same.  It really hurts after standing and at nigth when I go to sleep.  It also hurts bending forwrad.   Pertinent History HTN;  just got back from [place];  right shoulder surgery   Limitations House hold activities;Standing   How long can you sit comfortably? depends on what I'm sitting on  (need good support)   How long can you stand comfortably? 1-2 hours starts to hurt   How long can you walk comfortably? it depends;  I don't do as much since back has been bothering   Diagnostic tests going to be scheduled for x-ray   Patient Stated Goals not have back pain or at least manage it   Currently in Pain? No/denies                         [facility] Adult PT Treatment/Exercise - 12/17/16 0001      Neuro Re-ed    Neuro Re-ed Details  verbal and tactile cues to engage core throughout all activities     Lumbar Exercises: Stretches   Active Hamstring Stretch 3 reps;30 seconds     Lumbar Exercises: Aerobic   Stationary Bike NuStep: lvl 3, 5 min   cues to engage core throughout     Lumbar Exercises: Standing   Row Strengthening;Power tower;Both;20 reps   Row Limitations 15#   Shoulder Extension Strengthening;Power  Tower;Both;20 reps   Shoulder Extension Limitations 15#   Other Standing Lumbar Exercises TrA contraction while doing hip flexion, abduction, extension - 10x each side     Lumbar Exercises: Seated   Long Arc Quad on [place] Strengthening;Right;Left;20 reps   Hip Flexion on [employer] Strengthening;Right;Left;20 reps   Sit to Stand 20 reps  abdominal bracing each time     Lumbar Exercises: Supine   Bent Knee Raise 10 reps;3 seconds   Bent Knee Raise Limitations with abdominal contraction   Bridge 15 reps;3 seconds   Other Supine Lumbar Exercises rolling red ball with LE - 10x; SLR on red ball 10x   cues for TrA contraction     Lumbar Exercises: Prone   Straight Leg Raise 10 reps  cues to engage TrA and clutes   Other Prone Lumbar Exercises prone press ups  reduced pain     Cryotherapy   Number Minutes Cryotherapy 5 Minutes  while on nustep post treatment   Cryotherapy Location Lumbar Spine   Type of Cryotherapy Ice pack                  PT Short Term Goals - 12/03/16 1541      PT SHORT TERM  GOAL #1   Title The patient will express understanding of initial self management techniques including positioning for sleep  12/29/16   Time 4   Period Weeks   Status On-going     PT SHORT TERM GOAL #2   Title The patient will report a 25% improvement in sleep   Time 4   Period Weeks   Status On-going     PT SHORT TERM GOAL #3   Title The patient will have improved lumbar extension ROM to 25 degrees and right sidebending to 30 degrees needed for greater ease turning in bed   Time 4   Period Weeks   Status On-going           PT Long Term Goals - 12/17/16 0935      PT LONG TERM GOAL #1   Title The patient will be independent in safe self progression of HEP and return to gym program     Time 8   Period Weeks   Status On-going     PT LONG TERM GOAL #2   Title The patient will report a 60% improvement in sleep    Time 8   Period Weeks     PT LONG TERM GOAL #3    Title The patient will be able to stand/walk for 1 hour with minimal to no increase in pain   Time 8   Period Weeks   Status On-going     PT LONG TERM GOAL #4   Title Abdominal, trunk extensor and hip abductor strength grossly 4+/5 to 5-/5 needed for standing longer periods of time    Time 8   Period Weeks   Status On-going     PT LONG TERM GOAL #5   Title FOTO functional outcome score improved from 28% to 23% indicating improved function with less pain   Time 8   Period Weeks   Status On-going               Plan - 12/17/16 0904    Clinical Impression Statement Patient able to increase difficulty of exercises today.  Pt is doing better egaging the correct muscles without doing lumbar flexion.  Lumbar extension continues to feel good.  Pt will benefit from skilled PT to continue strength and posture.   PT Treatment/Interventions ADLs/Self Care Home Management;Electrical Stimulation;Cryotherapy;Moist Heat;Traction;Therapeutic activities;Therapeutic exercise;Neuromuscular re-education;Patient/family education;Taping;Dry needling   PT Next Visit Plan abdominal bracing progressions, multifidi, posutre   Consulted and Agree with Plan of Care Patient      Patient will benefit from skilled therapeutic intervention in order to improve the following deficits and impairments:  Pain, Decreased range of motion, Decreased strength, Postural dysfunction  Visit Diagnosis: Chronic right-sided low back pain without sciatica  Muscle weakness (generalized)     Problem List There are no active problems to display for this patient.   Sallyanne HaversJakki Crosser,PT 12/17/2016, 9:36 AM  [facility] [address], [address] [place], [place], [address] Phone: [phone]   Fax:  [phone]  Name: Teresa Fernandez MRN: [id number] Date of Birth: [birth year]

## 2016-12-18 ENCOUNTER — Ambulatory Visit: Payer: Federal, State, Local not specified - PPO | Admitting: Rehabilitative and Restorative Service Providers"

## 2016-12-18 DIAGNOSIS — M545 Low back pain: Principal | ICD-10-CM

## 2016-12-18 DIAGNOSIS — G8929 Other chronic pain: Secondary | ICD-10-CM | POA: Diagnosis not present

## 2016-12-18 DIAGNOSIS — M6281 Muscle weakness (generalized): Secondary | ICD-10-CM | POA: Diagnosis not present

## 2016-12-18 NOTE — Therapy (Signed)
[facility] [address], [address] [place], [place], [address] Phone: [phone]   Fax:  [phone]  Physical Therapy Treatment  Patient Details  Name: Teresa Fernandez MRN: [id number] Date of Birth: 1955/11/08 Referring Provider: Dibas Koirala  Encounter Date: 12/18/2016      PT End of Session - 12/18/16 0902    Visit Number 5   Date for PT Re-Evaluation 01/26/17   PT Start Time 0838   PT Stop Time 0933   PT Time Calculation (min) 55 min   Activity Tolerance Patient tolerated treatment well;No increased pain   Behavior During Therapy [facility] for tasks assessed/performed      No past medical history on file.  No past surgical history on file.  There were no vitals filed for this visit.      Subjective Assessment - 12/18/16 0842    Subjective My back is a little better but still bothers me if I move certain ways.   Pertinent History HTN;  [place];  [place]   Limitations House hold activities;Standing   How long can you sit comfortably? depends on what I'm sitting on  (need good support)   How long can you stand comfortably? 1-2 hours starts to hurt   How long can you walk comfortably? it depends;  I don't do as much since back has been bothering   Diagnostic tests going to be scheduled for x-ray   Patient Stated Goals not have back pain or at least manage it   Currently in Pain? No/denies                         [facility]/Exercise - 12/18/16 0001      Lumbar Exercises: Supine   Other Supine Lumbar Exercises tilt x 5 with PT verbal cues for techniques; tilt with march x 15, tilt withSLR x 15, tilt with hip flex/abdct combo x 15, tilt with bridge x 15; tilt with bridge and bil shoulder flex/ext x 15; tilt with hip flex/abdct combo with knee ext x 15; tilt with ball squeeze x 20; tilt with ball squeeze with bridge x 20; knee to chest 2x30 sec bil      Lumbar Exercises: Prone   Other Prone Lumbar Exercises unilat hip ext bil x 15; heel squeeze x 15 with 2-3 sec hold; tilt with donkey kick x 15     Lumbar Exercises: Quadruped   Single Arm Raise Right;Left;15 reps  with tilt   Straight Leg Raise 10 reps;Other (comment)  alt LE     Cryotherapy   Number Minutes Cryotherapy 10 Minutes   Cryotherapy Location Lumbar Spine   Type of Cryotherapy Ice pack                  PT Short Term Goals - 12/18/16 0908      PT SHORT TERM GOAL #1   Title The patient will express understanding of initial self management techniques including positioning for sleep  12/29/16   Time 4   Period Weeks   Status On-going     PT SHORT TERM GOAL #2   Title The patient will report a 25% improvement in sleep   Time 4   Period Weeks   Status On-going     PT SHORT TERM GOAL #3   Title The patient will have improved lumbar extension ROM to 25 degrees and right sidebending to 30 degrees needed  for greater ease turning in bed   Time 4   Period Weeks   Status On-going           PT Long Term Goals - 12/17/16 0935      PT LONG TERM GOAL #1   Title The patient will be independent in safe self progression of HEP and return to gym program     Time 8   Period Weeks   Status On-going     PT LONG TERM GOAL #2   Title The patient will report a 60% improvement in sleep    Time 8   Period Weeks     PT LONG TERM GOAL #3   Title The patient will be able to stand/walk for 1 hour with minimal to no increase in pain   Time 8   Period Weeks   Status On-going     PT LONG TERM GOAL #4   Title Abdominal, trunk extensor and hip abductor strength grossly 4+/5 to 5-/5 needed for standing longer periods of time    Time 8   Period Weeks   Status On-going     PT LONG TERM GOAL #5   Title FOTO functional outcome score improved from 28% to 23% indicating improved function with less pain   Time 8   Period Weeks   Status On-going                Plan - 12/18/16 0903    Clinical Impression Statement Pt presents to PT with decreased lumbar/core strength and would benefit from further therapy for further core strengthening.    Rehab Potential Good   Clinical Impairments Affecting Rehab Potential none   PT Frequency 2x / week   PT Duration 8 weeks   PT Treatment/Interventions ADLs/Self Care Home Management;Electrical Stimulation;Cryotherapy;Moist Heat;Traction;Therapeutic activities;Therapeutic exercise;Neuromuscular re-education;Patient/family education;Taping;Dry needling   PT Next Visit Plan abdominal bracing progressions, multifidi, posutre   Consulted and Agree with Plan of Care Patient      Patient will benefit from skilled therapeutic intervention in order to improve the following deficits and impairments:  Pain, Decreased range of motion, Decreased strength, Postural dysfunction  Visit Diagnosis: Chronic right-sided low back pain without sciatica  Muscle weakness (generalized)     Problem List There are no active problems to display for this patient.   Thornell Sartorius, PT 12/18/2016, 9:25 AM  [facility] [address], [address] [place], [place], [address] Phone: [phone]   Fax:  [phone]  Name: Teresa Fernandez MRN: [id number] Date of Birth: [birth year]

## 2016-12-21 ENCOUNTER — Ambulatory Visit: Payer: Federal, State, Local not specified - PPO | Admitting: Rehabilitative and Restorative Service Providers"

## 2016-12-21 DIAGNOSIS — G8929 Other chronic pain: Secondary | ICD-10-CM

## 2016-12-21 DIAGNOSIS — M6281 Muscle weakness (generalized): Secondary | ICD-10-CM | POA: Diagnosis not present

## 2016-12-21 DIAGNOSIS — M545 Low back pain: Secondary | ICD-10-CM | POA: Diagnosis not present

## 2016-12-21 NOTE — Therapy (Signed)
[facility] [address], [address] [place], [place], [address] Phone: [phone]   Fax:  [phone]  Physical Therapy Treatment  Patient Details  Name: Teresa Fernandez MRN: [id number] Date of Birth: 06-01-1955 Referring Provider: Dibas Koirala  Encounter Date: 12/21/2016      PT End of Session - 12/21/16 0946    Visit Number 6   Date for PT Re-Evaluation 01/26/17   PT Start Time 0940   PT Stop Time 1025   PT Time Calculation (min) 45 min   Activity Tolerance Patient tolerated treatment well;No increased pain   Behavior During Therapy [facility] tasks assessed/performed      No past medical history on file.  No past surgical history on file.  There were no vitals filed for this visit.      Subjective Assessment - 12/21/16 0943    Subjective My back is a little better but still bothers me if I move certain ways.   Limitations House hold activities;Standing   How long can you sit comfortably? depends on what I'm sitting on  (need good support)   How long can you stand comfortably? 1-2 hours starts to hurt   How long can you walk comfortably? it depends;  I don't do as much since back has been bothering   Diagnostic tests going to be scheduled for x-ray   Patient Stated Goals not have back pain or at least manage it   Currently in Pain? No/denies                         [facility] - 12/21/16 0001      Lumbar Exercises: Seated   Sit to Stand Limitations seated trunk flexion stretch in neutral and on diagonal x 15 sec each direction; hip hinge with core stabilization x 20; core stabilization with red theraband chest pull x 20; core stabilization with red theraband diagonal pull x 20; seated red theraband leg ext with core stabilization x 20; seated Piriformis stretch 2x30 sec bil     Lumbar Exercises: Supine   Other Supine Lumbar Exercises tilt with march x 10; isometric  bridge with core stabilization with bil shoulder flex/ext x 20; tilt with red theraball fwd roll x 20; diagonal red theraball rolls x 20; tilt into tabletop x 20; bridge x 20 with core tight; tilt with clam shell x 20; trunk rot with oblique crunch x 20. Pt noted to have greater L oblique contraction than R.                   PT Short Term Goals - 12/21/16 0950      PT SHORT TERM GOAL #1   Title The patient will express understanding of initial self management techniques including positioning for sleep  12/29/16   Time 4   Period Weeks   Status On-going     PT SHORT TERM GOAL #2   Title The patient will report a 25% improvement in sleep   Time 4   Period Weeks   Status On-going     PT SHORT TERM GOAL #3   Title The patient will have improved lumbar extension ROM to 25 degrees and right sidebending to 30 degrees needed for greater ease turning in bed   Time 4   Period Weeks   Status On-going           PT Long Term Goals - 12/17/16 [id number]  PT LONG TERM GOAL #1   Title The patient will be independent in safe self progression of HEP and return to gym program     Time 8   Period Weeks   Status On-going     PT LONG TERM GOAL #2   Title The patient will report a 60% improvement in sleep    Time 8   Period Weeks     PT LONG TERM GOAL #3   Title The patient will be able to stand/walk for 1 hour with minimal to no increase in pain   Time 8   Period Weeks   Status On-going     PT LONG TERM GOAL #4   Title Abdominal, trunk extensor and hip abductor strength grossly 4+/5 to 5-/5 needed for standing longer periods of time    Time 8   Period Weeks   Status On-going     PT LONG TERM GOAL #5   Title FOTO functional outcome score improved from 28% to 23% indicating improved function with less pain   Time 8   Period Weeks   Status On-going               Plan - 12/21/16 0948    Clinical Impression Statement Pt presents to PT with decreased core awareness  and with LBP still only with moving certain ways of which pt is unsure of aggravating factors. Pt would benefit from further PT for lumbar/core stabilization, body mechanics, and postural strengthening.    Rehab Potential Good   Clinical Impairments Affecting Rehab Potential none   PT Frequency 2x / week   PT Duration 8 weeks   PT Next Visit Plan abdominal bracing progressions, multifidi, posture   Consulted and Agree with Plan of Care Patient      Patient will benefit from skilled therapeutic intervention in order to improve the following deficits and impairments:  Pain, Decreased range of motion, Decreased strength, Postural dysfunction  Visit Diagnosis: Chronic right-sided low back pain without sciatica  Muscle weakness (generalized)     Problem List There are no active problems to display for this patient.   Thornell Sartorius, PT 12/21/2016, 10:25 AM   [facility] [address] [place], [place], [address] Phone: [phone]   Fax:  [phone]  Name: Teresa Fernandez MRN: [id number] Date of Birth: [birth year]

## 2016-12-24 ENCOUNTER — Ambulatory Visit: Payer: Federal, State, Local not specified - PPO | Admitting: Physical Therapy

## 2016-12-24 DIAGNOSIS — G8929 Other chronic pain: Secondary | ICD-10-CM

## 2016-12-24 DIAGNOSIS — M6281 Muscle weakness (generalized): Secondary | ICD-10-CM | POA: Diagnosis not present

## 2016-12-24 DIAGNOSIS — M545 Low back pain, unspecified: Secondary | ICD-10-CM

## 2016-12-24 NOTE — Therapy (Signed)
[facility] [address], [address] [place], [place], [address] Phone: [phone]   Fax:  [phone]  Physical Therapy Treatment  Patient Details  Name: Teresa Fernandez MRN: [id number] Date of Birth: 1955-12-28 Referring Provider: Dibas Koirala  Encounter Date: 12/24/2016      PT End of Session - 12/24/16 0855    Visit Number 7   Date for PT Re-Evaluation 01/26/17   PT Start Time 0850   PT Stop Time 0930   PT Time Calculation (min) 40 min   Activity Tolerance Patient tolerated treatment well;No increased pain   Behavior During Therapy [facility] for tasks assessed/performed      No past medical history on file.  No past surgical history on file.  There were no vitals filed for this visit.      Subjective Assessment - 12/24/16 0853    Subjective I am doing okay today.  States yesterday I had it bad because the muscles were sore, but states it was just muscle sore.  My pain was better than usual today.   Pertinent History HTN;  [place];  [place]   Limitations House hold activities;Standing   How long can you sit comfortably? depends on what I'm sitting on  (need good support)   Currently in Pain? No/denies                         [facility]/Exercise - 12/24/16 0001      Lumbar Exercises: Standing   Other Standing Lumbar Exercises walking with sports cord 15# - 5 laps 4 ways     Lumbar Exercises: Supine   Bent Knee Raise 10 reps;3 seconds   Bent Knee Raise Limitations with abdominal contraction   Bridge 15 reps;3 seconds   Other Supine Lumbar Exercises lying on foam roller - scap series(abduction, diagonals, flex/ext) with core stability, bilat knee abd/add     Lumbar Exercises: Quadruped   Single Arm Raise Right;Left;15 reps  with tilt   Straight Leg Raise 10 reps;Other (comment)  alt LE                  PT Short Term Goals - 12/21/16  0950      PT SHORT TERM GOAL #1   Title The patient will express understanding of initial self management techniques including positioning for sleep  12/29/16   Time 4   Period Weeks   Status On-going     PT SHORT TERM GOAL #2   Title The patient will report a 25% improvement in sleep   Time 4   Period Weeks   Status On-going     PT SHORT TERM GOAL #3   Title The patient will have improved lumbar extension ROM to 25 degrees and right sidebending to 30 degrees needed for greater ease turning in bed   Time 4   Period Weeks   Status On-going           PT Long Term Goals - 12/17/16 0935      PT LONG TERM GOAL #1   Title The patient will be independent in safe self progression of HEP and return to gym program     Time 8   Period Weeks   Status On-going     PT LONG TERM GOAL #2   Title The patient will report a 60% improvement in sleep    Time 8   Period  Weeks     PT LONG TERM GOAL #3   Title The patient will be able to stand/walk for 1 hour with minimal to no increase in pain   Time 8   Period Weeks   Status On-going     PT LONG TERM GOAL #4   Title Abdominal, trunk extensor and hip abductor strength grossly 4+/5 to 5-/5 needed for standing longer periods of time    Time 8   Period Weeks   Status On-going     PT LONG TERM GOAL #5   Title FOTO functional outcome score improved from 28% to 23% indicating improved function with less pain   Time 8   Period Weeks   Status On-going               Plan - 12/24/16 0925    Clinical Impression Statement Patient demonstrates good stability on foam roll.  She needs cues to not hold breath and keep core engaged correctly during exercises. She will continue to benefit from skilled PT for improved stability and body mechanics during strengthening.     PT Treatment/Interventions ADLs/Self Care Home Management;Electrical Stimulation;Cryotherapy;Moist Heat;Traction;Therapeutic activities;Therapeutic exercise;Neuromuscular  re-education;Patient/family education;Taping;Dry needling   PT Next Visit Plan abdominal bracing progressions, multifidi, posture   Consulted and Agree with Plan of Care Patient      Patient will benefit from skilled therapeutic intervention in order to improve the following deficits and impairments:  Pain, Decreased range of motion, Decreased strength, Postural dysfunction  Visit Diagnosis: Chronic right-sided low back pain without sciatica  Muscle weakness (generalized)     Problem List There are no active problems to display for this patient.   Vincente Poli, PT 12/24/2016, 9:28 AM  [facility] [address] [place], [place], [address] Phone: [phone]   Fax:  [phone]  Name: Teresa Fernandez MRN: [id number] Date of Birth: 10/28/1955

## 2016-12-28 ENCOUNTER — Encounter: Payer: Self-pay | Admitting: Rehabilitation

## 2016-12-28 ENCOUNTER — Ambulatory Visit: Payer: Federal, State, Local not specified - PPO | Admitting: Rehabilitation

## 2016-12-28 DIAGNOSIS — M545 Low back pain, unspecified: Secondary | ICD-10-CM

## 2016-12-28 DIAGNOSIS — G8929 Other chronic pain: Secondary | ICD-10-CM

## 2016-12-28 DIAGNOSIS — M6281 Muscle weakness (generalized): Secondary | ICD-10-CM | POA: Diagnosis not present

## 2016-12-28 NOTE — Therapy (Signed)
[facility] [address] [place], [place], [address] Phone: [phone]   Fax:  [phone]  Physical Therapy Treatment  Patient Details  Name: Teresa Fernandez MRN: [id number] Date of Birth: 1956-01-05 Referring Provider: Dibas Koirala  Encounter Date: 12/28/2016      PT End of Session - 12/28/16 0814    Visit Number 8   Date for PT Re-Evaluation 01/26/17   PT Start Time 0810   PT Stop Time 0840   PT Time Calculation (min) 30 min   Activity Tolerance Patient tolerated treatment well  late today      History reviewed. No pertinent past medical history.  History reviewed. No pertinent surgical history.  There were no vitals filed for this visit.      Subjective Assessment - 12/28/16 0806    Subjective it has been doing well overall except when doing laundry and cleaning the bathroom, but better than it normally is   Currently in Pain? No/denies                         [facility] Adult PT Treatment/Exercise - 12/28/16 0001      Lumbar Exercises: Standing   Other Standing Lumbar Exercises walking with sports cord 25# - 5 laps 4 ways     Lumbar Exercises: Supine   Bent Knee Raise 10 reps;3 seconds   Bent Knee Raise Limitations with abdominal contraction on FR   Bridge 15 reps;3 seconds   Bridge Limitations bridge with alt LE extension x 5 each   Other Supine Lumbar Exercises lying on foam roller - scap series(abduction, diagonals, flex/ext with green band) with core stability, bilat knee abd/add     Lumbar Exercises: Quadruped   Single Arm Raise Right;Left;15 reps   Single Arm Raises Limitations needing positional correction   Straight Leg Raise 10 reps   Opposite Arm/Leg Raise 10 reps                  PT Short Term Goals - 12/21/16 0950      PT SHORT TERM GOAL #1   Title The patient will express understanding of initial self management techniques including positioning  for sleep  12/29/16   Time 4   Period Weeks   Status On-going     PT SHORT TERM GOAL #2   Title The patient will report a 25% improvement in sleep   Time 4   Period Weeks   Status On-going     PT SHORT TERM GOAL #3   Title The patient will have improved lumbar extension ROM to 25 degrees and right sidebending to 30 degrees needed for greater ease turning in bed   Time 4   Period Weeks   Status On-going           PT Long Term Goals - 12/17/16 0935      PT LONG TERM GOAL #1   Title The patient will be independent in safe self progression of HEP and return to gym program     Time 8   Period Weeks   Status On-going     PT LONG TERM GOAL #2   Title The patient will report a 60% improvement in sleep    Time 8   Period Weeks     PT LONG TERM GOAL #3   Title The patient will be able to stand/walk for 1 hour with minimal to no increase in pain  Time 8   Period Weeks   Status On-going     PT LONG TERM GOAL #4   Title Abdominal, trunk extensor and hip abductor strength grossly 4+/5 to 5-/5 needed for standing longer periods of time    Time 8   Period Weeks   Status On-going     PT LONG TERM GOAL #5   Title FOTO functional outcome score improved from 28% to 23% indicating improved function with less pain   Time 8   Period Weeks   Status On-going               Plan - 12/28/16 0825    Clinical Impression Statement Good performance of all today.  Still needing cueing for foam roll and quadruped work to engage the core and keep pelvic alignment     PT Treatment/Interventions ADLs/Self Care Home Management;Electrical Stimulation;Cryotherapy;Moist Heat;Traction;Therapeutic activities;Therapeutic exercise;Neuromuscular re-education;Patient/family education;Taping;Dry needling   PT Next Visit Plan abdominal bracing progressions, multifidi, posture      Patient will benefit from skilled therapeutic intervention in order to improve the following deficits and  impairments:  Pain, Decreased range of motion, Decreased strength, Postural dysfunction  Visit Diagnosis: Chronic right-sided low back pain without sciatica  Muscle weakness (generalized)     Problem List There are no active problems to display for this patient.   Idamae Lusher, DPT, CMP 12/28/2016, 8:40 AM  [facility] [address] [place], [place], [address] Phone: [phone]   Fax:  [phone]  Name: Teresa Fernandez MRN: [id number] Date of Birth: 11/13/1955

## 2016-12-31 ENCOUNTER — Ambulatory Visit: Payer: Federal, State, Local not specified - PPO | Admitting: Physical Therapy

## 2016-12-31 ENCOUNTER — Encounter: Payer: Self-pay | Admitting: Physical Therapy

## 2016-12-31 DIAGNOSIS — M545 Low back pain, unspecified: Secondary | ICD-10-CM

## 2016-12-31 DIAGNOSIS — M6281 Muscle weakness (generalized): Secondary | ICD-10-CM

## 2016-12-31 DIAGNOSIS — G8929 Other chronic pain: Secondary | ICD-10-CM | POA: Diagnosis not present

## 2016-12-31 NOTE — Therapy (Signed)
[facility] [address], [place], [place], [address] Phone: [phone]   Fax:  [phone]  Physical Therapy Treatment  Patient Details  Name: Teresa Fernandez MRN: [id number] Date of Birth: 12-Dec-1955 Referring Provider: Dibas Koirala  Encounter Date: 12/31/2016      PT End of Session - 12/31/16 0808    Visit Number 9   Date for PT Re-Evaluation 01/26/17   PT Start Time 0807   PT Stop Time 0845   PT Time Calculation (min) 38 min   Activity Tolerance Patient tolerated treatment well   Behavior During Therapy [facility] for tasks assessed/performed      History reviewed. No pertinent past medical history.  History reviewed. No pertinent surgical history.  There were no vitals filed for this visit.      Subjective Assessment - 12/31/16 0809    Subjective I do the exercises when I can get them in.  My back hurts when I get into a certain position. Denies pain currently   Pertinent History HTN;  just got back from [place];  right shoulder surgery   Currently in Pain? No/denies                         [facility] Adult PT Treatment/Exercise - 12/31/16 0001      Lumbar Exercises: Stretches   Pelvic Tilt 2 reps;20 seconds  hip flexor stretch   Press Ups 5 reps;10 seconds     Lumbar Exercises: Aerobic   Elliptical ramp 4 L1; 4 min fwd, 4 min back     Lumbar Exercises: Supine   Bent Knee Raise 10 reps;3 seconds  LE then UE 2lb/LE   Bent Knee Raise Limitations with abdominal contraction on FR   Bridge 15 reps;3 seconds     Lumbar Exercises: Quadruped   Single Arm Raise Right;Left;15 reps   Single Arm Raises Limitations starting with just TrA contraction, needing positional correction   Opposite Arm/Leg Raise 10 reps                  PT Short Term Goals - 12/21/16 0950      PT SHORT TERM GOAL #1   Title The patient will express understanding of initial self management techniques  including positioning for sleep  12/29/16   Time 4   Period Weeks   Status On-going     PT SHORT TERM GOAL #2   Title The patient will report a 25% improvement in sleep   Time 4   Period Weeks   Status On-going     PT SHORT TERM GOAL #3   Title The patient will have improved lumbar extension ROM to 25 degrees and right sidebending to 30 degrees needed for greater ease turning in bed   Time 4   Period Weeks   Status On-going           PT Long Term Goals - 12/31/16 0813      PT LONG TERM GOAL #1   Title The patient will be independent in safe self progression of HEP and return to gym program     Time 8   Period Weeks   Status On-going     PT LONG TERM GOAL #2   Title The patient will report a 60% improvement in sleep    Baseline at least 70% better   Time 8   Period Weeks   Status Achieved     PT LONG TERM  GOAL #3   Title The patient will be able to stand/walk for 1 hour with minimal to no increase in pain   Time 8   Period Weeks   Status Achieved     PT LONG TERM GOAL #4   Title Abdominal, trunk extensor and hip abductor strength grossly 4+/5 to 5-/5 needed for standing longer periods of time    Time 8   Period Weeks   Status On-going     PT LONG TERM GOAL #5   Title FOTO functional outcome score improved from 28% to 23% indicating improved function with less pain   Period Weeks   Status On-going               Plan - 12/31/16 0809    Clinical Impression Statement Pt is more aware of her core overall and is able to do more at home when engaging her core.  Met long term goal for improved sleep and ability to stand/walk for one hour at a time.  Pt continues to need cues for engaging the core and not holding breath during functional movments.   PT Treatment/Interventions ADLs/Self Care Home Management;Electrical Stimulation;Cryotherapy;Moist Heat;Traction;Therapeutic activities;Therapeutic exercise;Neuromuscular re-education;Patient/family  education;Taping;Dry needling   PT Next Visit Plan abdominal bracing progressions, multifidi, posture   Consulted and Agree with Plan of Care Patient      Patient will benefit from skilled therapeutic intervention in order to improve the following deficits and impairments:  Pain, Decreased range of motion, Decreased strength, Postural dysfunction  Visit Diagnosis: Chronic right-sided low back pain without sciatica  Muscle weakness (generalized)     Problem List There are no active problems to display for this patient.   Zannie Cove, PT 12/31/2016, 11:29 AM  [facility] [address] [place], [place], [address] Phone: [phone]   Fax:  [phone]  Name: Teresa Fernandez MRN: [id number] Date of Birth: 12/07/1955

## 2017-01-04 ENCOUNTER — Encounter: Payer: Self-pay | Admitting: Rehabilitation

## 2017-01-04 ENCOUNTER — Ambulatory Visit: Payer: Federal, State, Local not specified - PPO | Admitting: Rehabilitation

## 2017-01-04 DIAGNOSIS — M545 Low back pain: Secondary | ICD-10-CM | POA: Diagnosis not present

## 2017-01-04 DIAGNOSIS — G8929 Other chronic pain: Secondary | ICD-10-CM | POA: Diagnosis not present

## 2017-01-04 DIAGNOSIS — M6281 Muscle weakness (generalized): Secondary | ICD-10-CM | POA: Diagnosis not present

## 2017-01-04 NOTE — Therapy (Signed)
[facility] [address], [place], [place], [address] Phone: [phone]   Fax:  [phone]  Physical Therapy Treatment  Patient Details  Name: Teresa Fernandez MRN: [id number] Date of Birth: [birth year] Referring Provider: Dibas Koirala  Encounter Date: 01/04/2017      PT End of Session - 01/04/17 0807    Visit Number 10   Date for PT Re-Evaluation 01/26/17   PT Start Time 0802   PT Stop Time 0843   PT Time Calculation (min) 41 min   Activity Tolerance Patient tolerated treatment well      History reviewed. No pertinent past medical history.  History reviewed. No pertinent surgical history.  There were no vitals filed for this visit.      Subjective Assessment - 01/04/17 0802    Subjective 10th visit today: Pt reports that she feels ready to be discharged with HEP at this point.  Still having pain in the back but feels ready to manage the pain at home.  May seek an xray for pain that she still is having   How long can you stand comfortably? no issues standing   How long can you walk comfortably? no issues walking   Currently in Pain? No/denies                         [facility] Adult PT Treatment/Exercise - 01/04/17 0001      Exercises   Exercises Other Exercises   Other Exercises  Review of final HEP with education on all;Review.  Performance of only bicycle per instruction.  Handout given     Lumbar Exercises: Aerobic   Elliptical ramp 4 L1; 4 min fwd, 4 min back                  PT Short Term Goals - 01/04/17 4401      PT SHORT TERM GOAL #1   Title The patient will express understanding of initial self management techniques including positioning for sleep  12/29/16   Status Achieved     PT SHORT TERM GOAL #2   Title The patient will report a 25% improvement in sleep   Status Achieved     PT SHORT TERM GOAL #3   Title The patient will have improved lumbar extension  ROM to 25 degrees and right sidebending to 30 degrees needed for greater ease turning in bed   Baseline lumbar AROM full and painfree all directions   Status Achieved           PT Long Term Goals - 01/04/17 0272      PT LONG TERM GOAL #1   Title The patient will be independent in safe self progression of HEP and return to gym program     Status Achieved     PT LONG TERM GOAL #2   Title The patient will report a 60% improvement in sleep    Status Achieved     PT LONG TERM GOAL #3   Title The patient will be able to stand/walk for 1 hour with minimal to no increase in pain   Status Achieved     PT LONG TERM GOAL #4   Title Abdominal, trunk extensor and hip abductor strength grossly 4+/5 to 5-/5 needed for standing longer periods of time    Baseline currently: 5/5 all LE MMT bilateral   Status Achieved     PT LONG TERM GOAL #5   Title  FOTO functional outcome score improved from 28% to 23% indicating improved function with less pain   Baseline remains at 28%    Status Not Met               Plan - 01/04/17 0844    Clinical Impression Statement Pt on her 10th visit update today reports that she feels okay with her self care at this point.  All LTGs have been met but she does continue to have intermittent back pain mainly with turning the wrong way.  In clinic today lumbar AROM with full without pain and pt had good knowledge of all TE   PT Home Exercise Plan updated today   Consulted and Agree with Plan of Care Patient      Patient will benefit from skilled therapeutic intervention in order to improve the following deficits and impairments:     Visit Diagnosis: Muscle weakness (generalized)  Chronic right-sided low back pain without sciatica     Problem List There are no active problems to display for this patient.   Stark Bray, DPT, CMP 01/04/2017, 8:48 AM  [facility] [address] [place], [place], [address] Phone: [phone]   Fax:  [phone]  Name: Teresa Fernandez MRN: [id number] Date of Birth: 1956/03/31  PHYSICAL THERAPY DISCHARGE SUMMARY  Visits from Start of Care: 10  Current functional level related to goals / functional outcomes: All met   Remaining deficits: See above   Education / Equipment: See above Plan: Patient agrees to discharge.  Patient goals were partially met. Patient is being discharged due to meeting the stated rehab goals.  ?????

## 2017-01-06 ENCOUNTER — Other Ambulatory Visit: Payer: Self-pay | Admitting: Family Medicine

## 2017-01-06 DIAGNOSIS — Z1231 Encounter for screening mammogram for malignant neoplasm of breast: Secondary | ICD-10-CM

## 2017-01-08 ENCOUNTER — Encounter: Payer: Federal, State, Local not specified - PPO | Admitting: Rehabilitation

## 2017-01-15 DIAGNOSIS — G4733 Obstructive sleep apnea (adult) (pediatric): Secondary | ICD-10-CM | POA: Diagnosis not present

## 2017-01-18 DIAGNOSIS — G4733 Obstructive sleep apnea (adult) (pediatric): Secondary | ICD-10-CM | POA: Diagnosis not present

## 2017-02-12 ENCOUNTER — Ambulatory Visit
Admission: RE | Admit: 2017-02-12 | Discharge: 2017-02-12 | Disposition: A | Discharge status: Home / Self Care | Payer: Federal, State, Local not specified - PPO | Source: Ambulatory Visit | Attending: Family Medicine | Admitting: Family Medicine

## 2017-02-12 DIAGNOSIS — Z1231 Encounter for screening mammogram for malignant neoplasm of breast: Secondary | ICD-10-CM | POA: Diagnosis not present

## 2017-02-16 ENCOUNTER — Other Ambulatory Visit: Payer: Self-pay | Admitting: Family Medicine

## 2017-02-16 DIAGNOSIS — R928 Other abnormal and inconclusive findings on diagnostic imaging of breast: Secondary | ICD-10-CM

## 2017-02-22 ENCOUNTER — Ambulatory Visit
Admission: RE | Admit: 2017-02-22 | Discharge: 2017-02-22 | Disposition: A | Discharge status: Home / Self Care | Payer: Federal, State, Local not specified - PPO | Source: Ambulatory Visit | Attending: Family Medicine | Admitting: Family Medicine

## 2017-02-22 DIAGNOSIS — R928 Other abnormal and inconclusive findings on diagnostic imaging of breast: Secondary | ICD-10-CM

## 2017-02-22 DIAGNOSIS — N6322 Unspecified lump in the left breast, upper inner quadrant: Secondary | ICD-10-CM | POA: Diagnosis not present

## 2017-02-22 DIAGNOSIS — R922 Inconclusive mammogram: Secondary | ICD-10-CM | POA: Diagnosis not present

## 2017-03-10 DIAGNOSIS — K080 Exfoliation of teeth due to systemic causes: Secondary | ICD-10-CM | POA: Diagnosis not present

## 2017-05-27 DIAGNOSIS — Z0000 Encounter for general adult medical examination without abnormal findings: Secondary | ICD-10-CM | POA: Diagnosis not present

## 2017-05-27 DIAGNOSIS — I10 Essential (primary) hypertension: Secondary | ICD-10-CM | POA: Diagnosis not present

## 2017-05-27 DIAGNOSIS — E7800 Pure hypercholesterolemia, unspecified: Secondary | ICD-10-CM | POA: Diagnosis not present

## 2017-05-27 DIAGNOSIS — Z23 Encounter for immunization: Secondary | ICD-10-CM | POA: Diagnosis not present

## 2017-06-23 DIAGNOSIS — G4733 Obstructive sleep apnea (adult) (pediatric): Secondary | ICD-10-CM | POA: Diagnosis not present

## 2017-07-19 DIAGNOSIS — G4733 Obstructive sleep apnea (adult) (pediatric): Secondary | ICD-10-CM | POA: Diagnosis not present

## 2017-07-27 DIAGNOSIS — Z23 Encounter for immunization: Secondary | ICD-10-CM | POA: Diagnosis not present

## 2017-09-29 DIAGNOSIS — K080 Exfoliation of teeth due to systemic causes: Secondary | ICD-10-CM | POA: Diagnosis not present

## 2017-12-27 ENCOUNTER — Encounter: Payer: Self-pay | Admitting: Podiatry

## 2017-12-27 ENCOUNTER — Ambulatory Visit (INDEPENDENT_AMBULATORY_CARE_PROVIDER_SITE_OTHER): Payer: Federal, State, Local not specified - PPO

## 2017-12-27 ENCOUNTER — Ambulatory Visit: Payer: Federal, State, Local not specified - PPO | Admitting: Podiatry

## 2017-12-27 DIAGNOSIS — M722 Plantar fascial fibromatosis: Secondary | ICD-10-CM

## 2017-12-27 DIAGNOSIS — M205X1 Other deformities of toe(s) (acquired), right foot: Secondary | ICD-10-CM

## 2017-12-27 MED ORDER — TRIAMCINOLONE ACETONIDE 10 MG/ML IJ SUSP
10.0000 mg | Freq: Once | INTRAMUSCULAR | Status: AC
  Administered 2017-12-27: 10 mg

## 2017-12-27 MED ORDER — MELOXICAM 15 MG PO TABS: 15.0000 mg | ORAL_TABLET | Freq: Every day | ORAL | 0 refills | Status: AC

## 2017-12-27 NOTE — Progress Notes (Signed)
   Subjective:    Patient ID: Teresa Fernandez, female    DOB: 07-12-1955, 62 y.o.   MRN: [id number]  HPI 62 year old female presents the office today for concerns of right heel pain which is been ongoing for about 2 months.  She states that she has pain with walking and standing it hurts on the bottom of the heel.  She denies any recent injury or trauma denies any swelling or redness.  No numbness or tingling.  The pain does not wake her up at night.  She has no other concerns today.   Review of Systems  All other systems reviewed and are negative.  History reviewed. No pertinent past medical history.  History reviewed. No pertinent surgical history.   Current Outpatient Medications:  .  hydrochlorothiazide (HYDRODIURIL) 25 MG tablet, TK 1 T PO QD, Disp: , Rfl: 4 .  meloxicam (MOBIC) 15 MG tablet, Take 1 tablet (15 mg total) by mouth daily., Disp: 30 tablet, Rfl: 0  No Known Allergies       Objective:   Physical Exam  General: AAO x3, NAD  Dermatological: Skin is warm, dry and supple bilateral. Nails x 10 are well manicured; remaining integument appears unremarkable at this time. There are no open sores, no preulcerative lesions, no rash or signs of infection present.  Vascular: Dorsalis Pedis artery and Posterior Tibial artery pedal pulses are 2/4 bilateral with immedate capillary fill time. Pedal hair growth present. No varicosities and no lower extremity edema present bilateral. There is no pain with calf compression, swelling, warmth, erythema.   Neruologic: Grossly intact via light touch bilateral. Vibratory intact via tuning fork bilateral. Protective threshold with Semmes Wienstein monofilament intact to all pedal sites bilateral.Negative tinel sign  Musculoskeletal: Tenderness to palpation along the plantar medial tubercle of the calcaneus at the insertion of plantar fascia on the right foot. There is no pain along the course of the plantar fascia within the arch of  the foot. Plantar fascia appears to be intact. There is no pain with lateral compression of the calcaneus or pain with vibratory sensation. There is no pain along the course or insertion of the achilles tendon. No other areas of tenderness to bilateral lower extremities. Spurring present on the dorsal MPJs bilaterally with decreased range of motion and MPJ range of motion.  Muscular strength 5/5 in all groups tested bilateral.  Gait: Unassisted, Nonantalgic.     Assessment & Plan:  62 year old female with right foot heel pain, plantar fasciitis, hallux limitus -Treatment options discussed including all alternatives, risks, and complications -Etiology of symptoms were discussed -X-rays were obtained and reviewed with the patient.  Fracture or stress fracture.  Arthritic changes present the first MPJ. -Steroid injection performed see procedure note -Plantar fascial brace dispensed -Prescribed mobic. Discussed side effects of the medication and directed to stop if any are to occur and call the office.  -Stretching, icing exercises daily. -Shoe modifications and orthotic discussed  Procedure: Injection Tendon/Ligament Discussed alternatives, risks, complications and verbal consent was obtained.  Location: Right plantar fascia at the glabrous junction; medial approach. Skin Prep: Alcohol. Injectate: 0.5cc 0.5% marcaine plain, 0.5 cc 2% lidocaine plain and, 1 cc kenalog 10. Disposition: Patient tolerated procedure well. Injection site dressed with a band-aid.  Post-injection care was discussed and return precautions discussed.    Vivi BarrackMatthew R Ary Rudnick DPM

## 2017-12-27 NOTE — Patient Instructions (Signed)

## 2017-12-28 DIAGNOSIS — M722 Plantar fascial fibromatosis: Secondary | ICD-10-CM | POA: Insufficient documentation

## 2018-01-03 DIAGNOSIS — H43811 Vitreous degeneration, right eye: Secondary | ICD-10-CM | POA: Diagnosis not present

## 2018-01-17 ENCOUNTER — Ambulatory Visit: Payer: Federal, State, Local not specified - PPO | Admitting: Podiatry

## 2018-01-21 DIAGNOSIS — G4733 Obstructive sleep apnea (adult) (pediatric): Secondary | ICD-10-CM | POA: Diagnosis not present

## 2018-01-25 DIAGNOSIS — H43811 Vitreous degeneration, right eye: Secondary | ICD-10-CM | POA: Diagnosis not present

## 2018-01-28 ENCOUNTER — Other Ambulatory Visit: Payer: Self-pay | Admitting: Family Medicine

## 2018-01-28 DIAGNOSIS — Z1231 Encounter for screening mammogram for malignant neoplasm of breast: Secondary | ICD-10-CM

## 2018-02-24 ENCOUNTER — Ambulatory Visit
Admission: RE | Admit: 2018-02-24 | Discharge: 2018-02-24 | Disposition: A | Discharge status: Home / Self Care | Payer: Federal, State, Local not specified - PPO | Source: Ambulatory Visit | Attending: Family Medicine | Admitting: Family Medicine

## 2018-02-24 DIAGNOSIS — Z1231 Encounter for screening mammogram for malignant neoplasm of breast: Secondary | ICD-10-CM | POA: Diagnosis not present

## 2018-02-25 ENCOUNTER — Other Ambulatory Visit: Payer: Self-pay | Admitting: Family Medicine

## 2018-02-25 DIAGNOSIS — R928 Other abnormal and inconclusive findings on diagnostic imaging of breast: Secondary | ICD-10-CM

## 2018-03-04 ENCOUNTER — Ambulatory Visit
Admission: RE | Admit: 2018-03-04 | Discharge: 2018-03-04 | Disposition: A | Discharge status: Home / Self Care | Payer: Federal, State, Local not specified - PPO | Source: Ambulatory Visit | Attending: Family Medicine | Admitting: Family Medicine

## 2018-03-04 ENCOUNTER — Ambulatory Visit: Payer: Federal, State, Local not specified - PPO

## 2018-03-04 DIAGNOSIS — R928 Other abnormal and inconclusive findings on diagnostic imaging of breast: Secondary | ICD-10-CM | POA: Diagnosis not present

## 2018-03-31 DIAGNOSIS — K080 Exfoliation of teeth due to systemic causes: Secondary | ICD-10-CM | POA: Diagnosis not present

## 2018-04-19 DIAGNOSIS — M65339 Trigger finger, unspecified middle finger: Secondary | ICD-10-CM | POA: Diagnosis not present

## 2018-04-19 DIAGNOSIS — M19049 Primary osteoarthritis, unspecified hand: Secondary | ICD-10-CM | POA: Diagnosis not present

## 2018-05-12 DIAGNOSIS — M65331 Trigger finger, right middle finger: Secondary | ICD-10-CM | POA: Diagnosis not present

## 2018-05-12 DIAGNOSIS — M65332 Trigger finger, left middle finger: Secondary | ICD-10-CM | POA: Diagnosis not present

## 2018-05-12 DIAGNOSIS — M1811 Unilateral primary osteoarthritis of first carpometacarpal joint, right hand: Secondary | ICD-10-CM | POA: Diagnosis not present

## 2018-06-06 DIAGNOSIS — Z13220 Encounter for screening for lipoid disorders: Secondary | ICD-10-CM | POA: Diagnosis not present

## 2018-06-06 DIAGNOSIS — I10 Essential (primary) hypertension: Secondary | ICD-10-CM | POA: Diagnosis not present

## 2018-06-06 DIAGNOSIS — Z0000 Encounter for general adult medical examination without abnormal findings: Secondary | ICD-10-CM | POA: Diagnosis not present

## 2018-06-23 DIAGNOSIS — G4733 Obstructive sleep apnea (adult) (pediatric): Secondary | ICD-10-CM | POA: Diagnosis not present

## 2018-06-30 DIAGNOSIS — H35373 Puckering of macula, bilateral: Secondary | ICD-10-CM | POA: Diagnosis not present

## 2018-07-26 ENCOUNTER — Ambulatory Visit: Payer: Federal, State, Local not specified - PPO | Admitting: Podiatry

## 2018-07-26 ENCOUNTER — Other Ambulatory Visit: Payer: Self-pay | Admitting: Podiatry

## 2018-07-26 ENCOUNTER — Ambulatory Visit (INDEPENDENT_AMBULATORY_CARE_PROVIDER_SITE_OTHER): Payer: Federal, State, Local not specified - PPO

## 2018-07-26 ENCOUNTER — Other Ambulatory Visit: Payer: Self-pay

## 2018-07-26 DIAGNOSIS — L600 Ingrowing nail: Secondary | ICD-10-CM | POA: Diagnosis not present

## 2018-07-26 DIAGNOSIS — M779 Enthesopathy, unspecified: Secondary | ICD-10-CM

## 2018-07-26 DIAGNOSIS — B351 Tinea unguium: Secondary | ICD-10-CM

## 2018-07-26 DIAGNOSIS — M21612 Bunion of left foot: Secondary | ICD-10-CM

## 2018-07-26 DIAGNOSIS — G4733 Obstructive sleep apnea (adult) (pediatric): Secondary | ICD-10-CM | POA: Diagnosis not present

## 2018-07-26 DIAGNOSIS — M21611 Bunion of right foot: Secondary | ICD-10-CM | POA: Diagnosis not present

## 2018-07-26 MED ORDER — DICLOFENAC SODIUM 1 % TD GEL: 2.0000 g | Freq: Four times a day (QID) | TRANSDERMAL | 2 refills | Status: DC

## 2018-07-26 NOTE — Patient Instructions (Signed)
You can try "funginail" on your toenails. We can also do a prescription antifungal if needed  Have a great week!

## 2018-08-01 NOTE — Progress Notes (Signed)
Subjective: 63 year old female presents the office today for concerns of bunions to both of her feet with the right side worse than left.  She states is aggravated with shoes and pressure.  She denies any recent injury or trauma.  No significant treatment.  This is been a chronic issue for the last couple of years.  She states that she was concerned about gout one point.  She also states the nail itself is become somewhat uncomfortable at times but denies any redness or drainage or any swelling.  The nail has started to change colors. Denies any systemic complaints such as fevers, chills, nausea, vomiting. No acute changes since last appointment, and no other complaints at this time.   Objective: AAO x3, NAD DP/PT pulses palpable bilaterally, CRT less than 3 seconds Bilateral bunions are present there is also dorsal exostosis of the first MPJ.  There is decreased range of motion of the left first MPJ worse than right.  Mild crepitation with MPJ range of motion on the left side. The nail itself is somewhat discolored with yellow-brown discoloration starting to get ingrown but there is currently no pain.  There is no edema, erythema, drainage of pus or any clinical signs of infection noted. No open lesions or pre-ulcerative lesions.  No pain with calf compression, swelling, warmth, erythema  Assessment: Bilateral HAV, hallux limitus; onychomycosis  Plan: -All treatment options discussed with the patient including all alternatives, risks, complications.  -X-rays were obtained and reviewed.  Bunion deformities present with arthritic changes are present in the first MPJ on the left side worse than right. -At this time we discussed treatment options.  In regards to the bunions, hallux limitus we discussed wearing a stiffer soled shoe but also the material to help avoid pressure to the area.  Currently no discomfort on palpation we can consider steroid injection as well.  Given the x-ray findings which are  consistent with arthritis as well as a bunion we discussed surgical intervention.  Likely first MPJ arthrodesis. -Discussed over-the-counter fungal nail for the nail fungus.  Discussed nail trimming techniques. -Patient encouraged to call the office with any questions, concerns, change in symptoms.

## 2018-09-12 DIAGNOSIS — E7800 Pure hypercholesterolemia, unspecified: Secondary | ICD-10-CM | POA: Diagnosis not present

## 2019-01-27 ENCOUNTER — Other Ambulatory Visit: Payer: Self-pay | Admitting: Family Medicine

## 2019-01-27 DIAGNOSIS — Z1231 Encounter for screening mammogram for malignant neoplasm of breast: Secondary | ICD-10-CM

## 2019-01-30 DIAGNOSIS — G4733 Obstructive sleep apnea (adult) (pediatric): Secondary | ICD-10-CM | POA: Diagnosis not present

## 2019-01-31 DIAGNOSIS — Z23 Encounter for immunization: Secondary | ICD-10-CM | POA: Diagnosis not present

## 2019-03-13 ENCOUNTER — Ambulatory Visit
Admission: RE | Admit: 2019-03-13 | Discharge: 2019-03-13 | Disposition: A | Discharge status: Home / Self Care | Payer: Federal, State, Local not specified - PPO | Source: Ambulatory Visit | Attending: Family Medicine | Admitting: Family Medicine

## 2019-03-13 ENCOUNTER — Other Ambulatory Visit: Payer: Self-pay

## 2019-03-13 DIAGNOSIS — Z1231 Encounter for screening mammogram for malignant neoplasm of breast: Secondary | ICD-10-CM | POA: Diagnosis not present

## 2019-04-13 DIAGNOSIS — K080 Exfoliation of teeth due to systemic causes: Secondary | ICD-10-CM | POA: Diagnosis not present

## 2019-06-13 DIAGNOSIS — Z0000 Encounter for general adult medical examination without abnormal findings: Secondary | ICD-10-CM | POA: Diagnosis not present

## 2019-06-20 DIAGNOSIS — G4733 Obstructive sleep apnea (adult) (pediatric): Secondary | ICD-10-CM | POA: Diagnosis not present

## 2019-07-02 ENCOUNTER — Ambulatory Visit: Payer: Federal, State, Local not specified - PPO | Attending: Internal Medicine

## 2019-07-02 DIAGNOSIS — Z23 Encounter for immunization: Secondary | ICD-10-CM | POA: Insufficient documentation

## 2019-07-02 NOTE — Progress Notes (Signed)
   Covid-19 Vaccination Clinic  Name:  Teresa Fernandez    MRN: [id number] DOB: December 22, 1955  07/02/2019  [name] was observed post Covid-19 immunization for 15 minutes without incidence. She was provided with Vaccine Information Sheet and instruction to access the V-Safe system.   [name] was instructed to call 911 with any severe reactions post vaccine: Marland Kitchen Difficulty breathing  . Swelling of your face and throat  . A fast heartbeat  . A bad rash all over your body  . Dizziness and weakness    Immunizations Administered    Name Date Dose VIS Date Route   Pfizer COVID-19 Vaccine 07/02/2019  3:10 PM 0.3 mL 04/21/2019 Intramuscular   Manufacturer: [employer], [employer]   Lot: [id number]   NDC: [address]-9

## 2019-07-05 DIAGNOSIS — I10 Essential (primary) hypertension: Secondary | ICD-10-CM | POA: Diagnosis not present

## 2019-07-05 DIAGNOSIS — E7800 Pure hypercholesterolemia, unspecified: Secondary | ICD-10-CM | POA: Diagnosis not present

## 2019-07-26 ENCOUNTER — Ambulatory Visit: Payer: Federal, State, Local not specified - PPO | Attending: Internal Medicine

## 2019-07-26 DIAGNOSIS — Z23 Encounter for immunization: Secondary | ICD-10-CM

## 2019-07-26 NOTE — Progress Notes (Signed)
   Covid-19 Vaccination Clinic  Name:  Teresa Fernandez    MRN: [id number] DOB: 04-09-1956  07/26/2019  [name] was observed post Covid-19 immunization for 15 minutes without incident. She was provided with Vaccine Information Sheet and instruction to access the V-Safe system.   [name] was instructed to call 911 with any severe reactions post vaccine: Marland Kitchen Difficulty breathing  . Swelling of face and throat  . A fast heartbeat  . A bad rash all over body  . Dizziness and weakness   Immunizations Administered    Name Date Dose VIS Date Route   Pfizer COVID-19 Vaccine 07/26/2019 10:20 AM 0.3 mL 04/21/2019 Intramuscular   Manufacturer: [employer], [employer]   Lot: [id number]   NDC: [address]-7

## 2019-07-27 ENCOUNTER — Other Ambulatory Visit: Payer: Self-pay | Admitting: Family Medicine

## 2019-07-27 ENCOUNTER — Other Ambulatory Visit (HOSPITAL_COMMUNITY)
Admission: RE | Admit: 2019-07-27 | Discharge: 2019-07-27 | Disposition: A | Discharge status: Home / Self Care | Payer: Federal, State, Local not specified - PPO | Source: Ambulatory Visit | Attending: Family Medicine | Admitting: Family Medicine

## 2019-07-27 DIAGNOSIS — E876 Hypokalemia: Secondary | ICD-10-CM | POA: Diagnosis not present

## 2019-07-27 DIAGNOSIS — Z124 Encounter for screening for malignant neoplasm of cervix: Secondary | ICD-10-CM | POA: Diagnosis not present

## 2019-07-27 DIAGNOSIS — Z01419 Encounter for gynecological examination (general) (routine) without abnormal findings: Secondary | ICD-10-CM | POA: Diagnosis not present

## 2019-07-28 LAB — CYTOLOGY - PAP
Comment: NEGATIVE
Diagnosis: NEGATIVE
High risk HPV: NEGATIVE

## 2019-08-01 DIAGNOSIS — G4733 Obstructive sleep apnea (adult) (pediatric): Secondary | ICD-10-CM | POA: Diagnosis not present

## 2019-08-21 DIAGNOSIS — Z23 Encounter for immunization: Secondary | ICD-10-CM | POA: Diagnosis not present

## 2019-11-08 DIAGNOSIS — H35373 Puckering of macula, bilateral: Secondary | ICD-10-CM | POA: Diagnosis not present

## 2019-12-28 IMAGING — MG DIGITAL SCREENING BILATERAL MAMMOGRAM WITH TOMO AND CAD
8 series · 8 of 24 positions shown · non-contrast
Comparison: Previous exam(s).

CLINICAL DATA: Screening.

EXAM:
DIGITAL SCREENING BILATERAL MAMMOGRAM WITH TOMO AND CAD

[R MLO synth-2D]
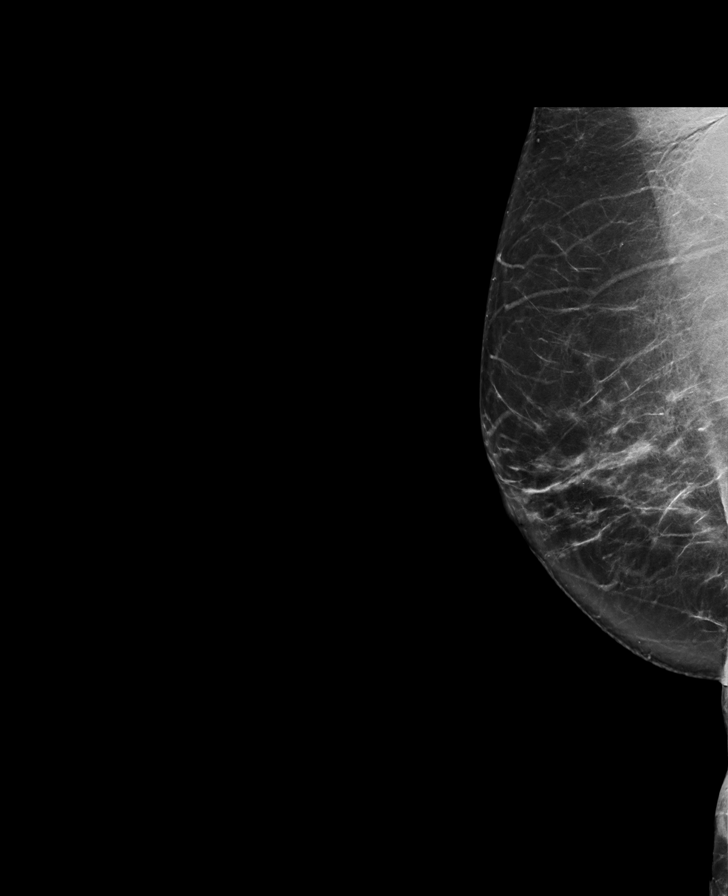

[L CC synth-2D]
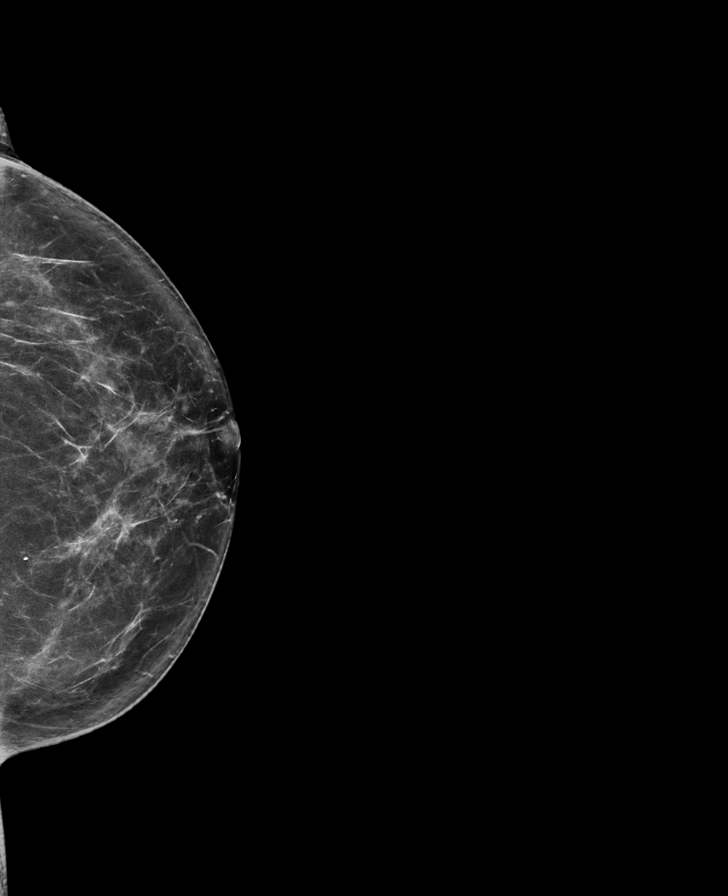

[R CC synth-2D]
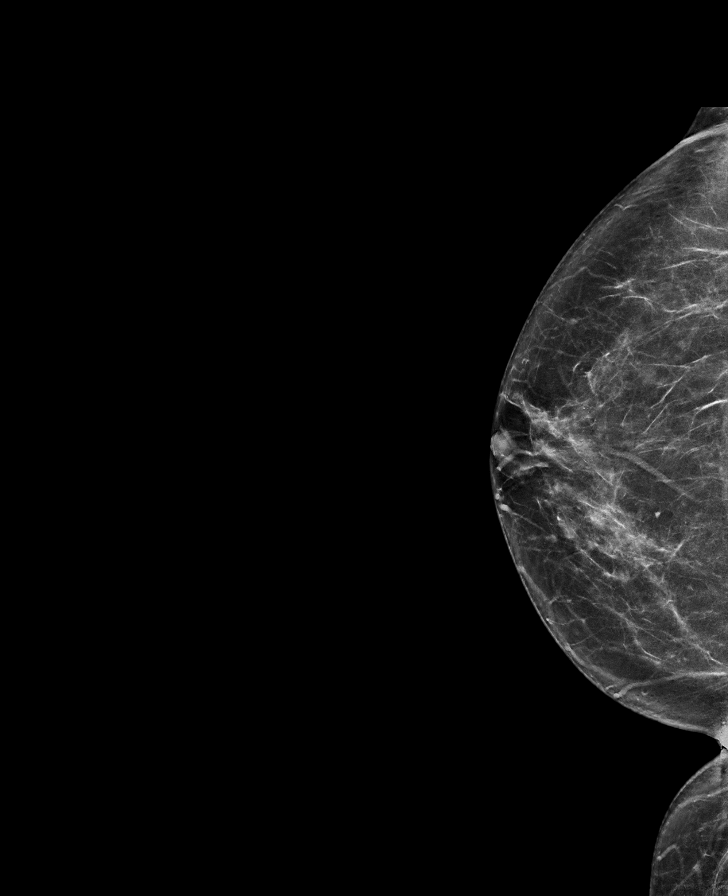

[L MLO synth-2D]
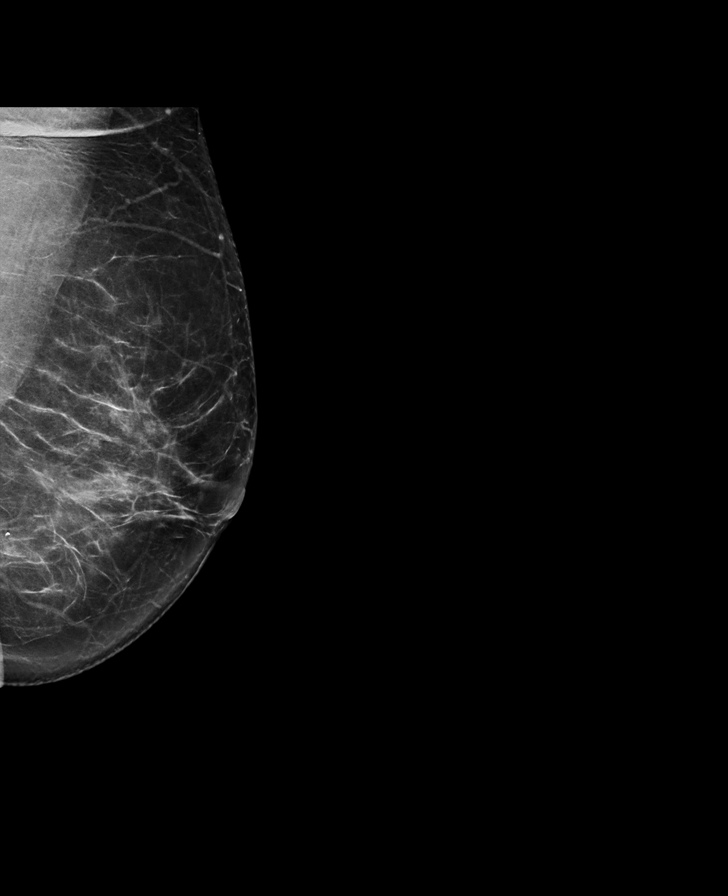

[R MLO tomo · tomo slice 43/84.0]
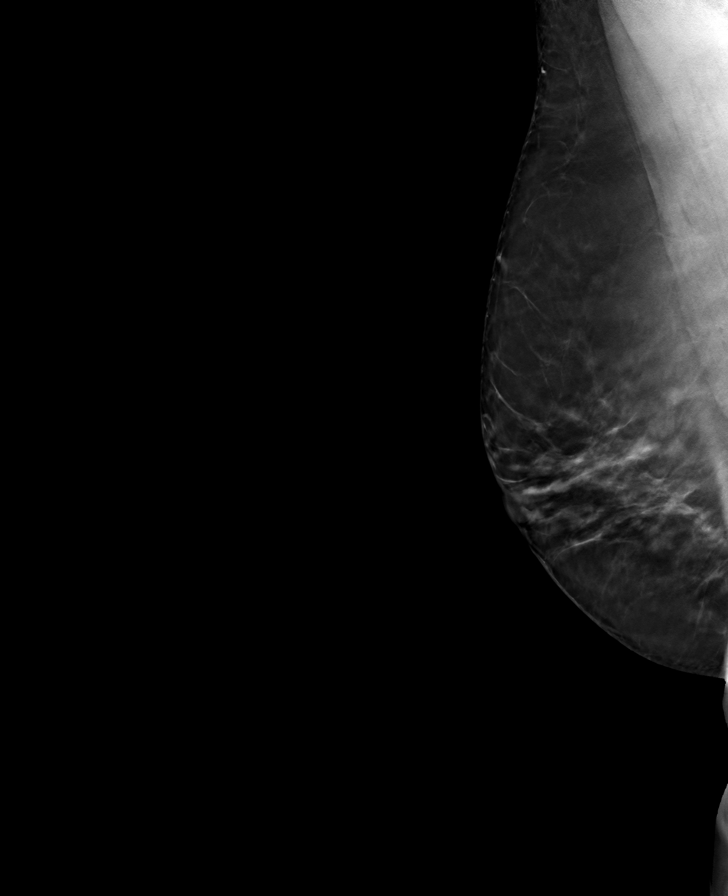

[L CC tomo · tomo slice 39/76.0]
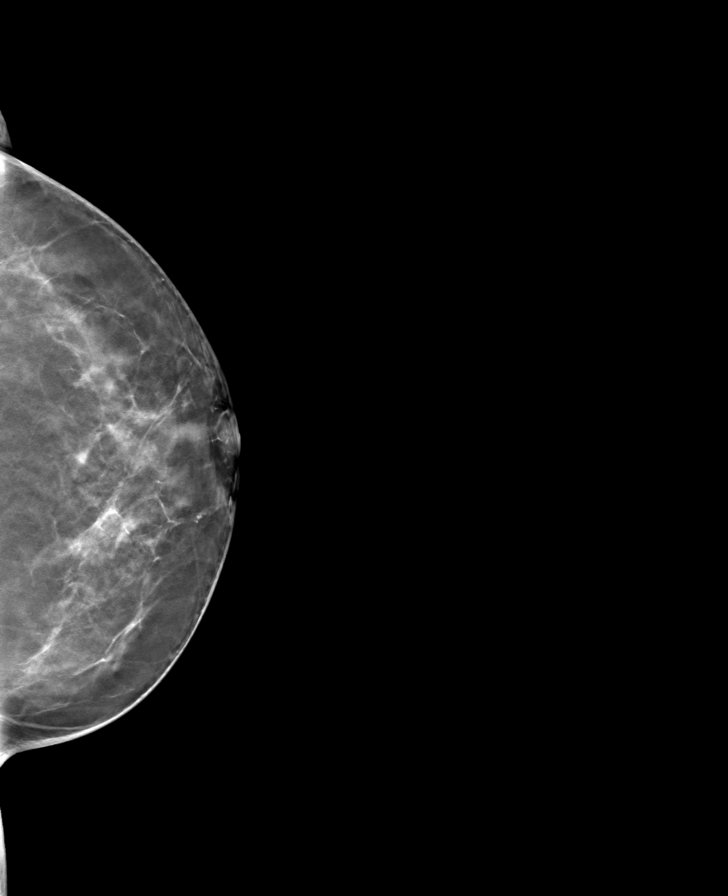

[R CC tomo · tomo slice 35/70.0]
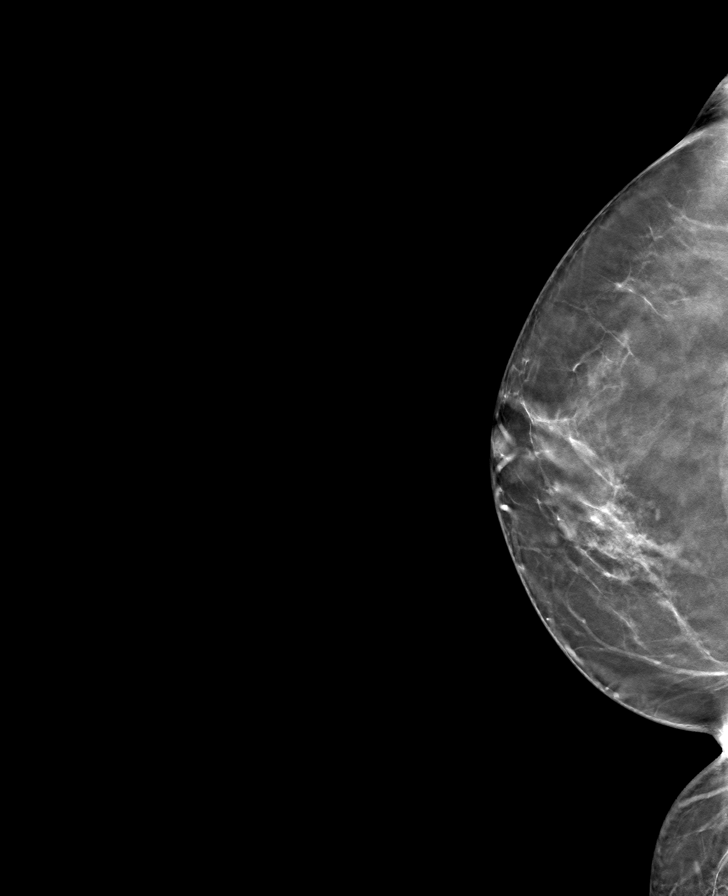

[L MLO tomo · tomo slice 41/80.0]
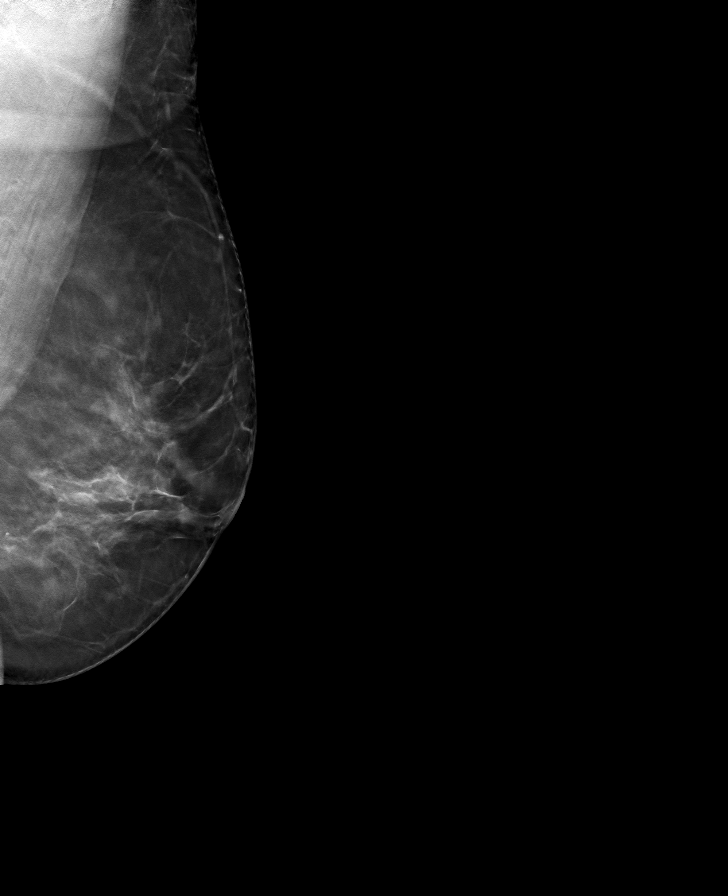

[8 of 24 positions shown; findings below may reference images not displayed]

ACR Breast Density Category b: There are scattered areas of
fibroglandular density.
FINDINGS: In the right breast, a possible mass warrants further evaluation.
This possible mass is seen within the central RIGHT breast on the
MLO view, slice 53, possible correlate within the inner RIGHT breast
on the CC view.

In the left breast, no findings suspicious for malignancy. Images
were processed with CAD.
IMPRESSION: Further evaluation is suggested for possible mass in the right
breast.

RECOMMENDATION:
Diagnostic mammogram and possibly ultrasound of the right breast.
(Code:[id number])

The patient will be contacted regarding the findings, and additional
imaging will be scheduled.

BI-RADS CATEGORY  0: Incomplete. Need additional imaging evaluation
and/or prior mammograms for comparison.

## 2020-01-29 DIAGNOSIS — G4733 Obstructive sleep apnea (adult) (pediatric): Secondary | ICD-10-CM | POA: Diagnosis not present

## 2020-02-12 ENCOUNTER — Other Ambulatory Visit: Payer: Self-pay | Admitting: Family Medicine

## 2020-02-12 DIAGNOSIS — Z1231 Encounter for screening mammogram for malignant neoplasm of breast: Secondary | ICD-10-CM

## 2020-02-13 DIAGNOSIS — Z23 Encounter for immunization: Secondary | ICD-10-CM | POA: Diagnosis not present

## 2020-03-06 DIAGNOSIS — H35373 Puckering of macula, bilateral: Secondary | ICD-10-CM | POA: Diagnosis not present

## 2020-03-13 ENCOUNTER — Other Ambulatory Visit: Payer: Self-pay

## 2020-03-13 ENCOUNTER — Ambulatory Visit
Admission: RE | Admit: 2020-03-13 | Discharge: 2020-03-13 | Disposition: A | Discharge status: Home / Self Care | Payer: Federal, State, Local not specified - PPO | Source: Ambulatory Visit | Attending: Family Medicine | Admitting: Family Medicine

## 2020-03-13 DIAGNOSIS — Z1231 Encounter for screening mammogram for malignant neoplasm of breast: Secondary | ICD-10-CM

## 2020-03-20 NOTE — Progress Notes (Addendum)
[facility] Note  03/21/2020     CHIEF COMPLAINT Patient presents for Retina Evaluation   HISTORY OF PRESENT ILLNESS: Teresa Fernandez is a 63 y.o. female who presents to the clinic today for:   HPI    Retina Evaluation    In both eyes.  This started 7 months ago.  Duration of 7 months.  Associated Symptoms Floaters.  Context:  distance vision and near vision.  I, the attending physician,  performed the HPI with the patient and updated documentation appropriately.          Comments    Pt states her vision has gotten worse in her right eye over the last 7 months.  Patient states her left eye is becoming weaker as well.  Patient has constant floaters OU but has learned to ignore them.         Last edited by Bernarda Caffey, MD on 03/21/2020 11:05 PM. (History)    pt is here on the referral of Dr. Macarthur Critchley for concern of ERM, pt states Dr. Nicki Reaper told her she had "damage" to the back of her eye that is causing her blurry vision, pt denies any hx of eye surgeries or any problems wit her eyes other than wearing glasses, pt denies being diabetic, but takes medication for BP and cholesterol  Referring physician: Macarthur Critchley, [address] [place] [address]  HISTORICAL INFORMATION:   Selected notes from the MEDICAL RECORD NUMBER Referred by Dr. Macarthur Critchley LEE:  Ocular Hx- PMH-    CURRENT MEDICATIONS: No current outpatient medications on file. (Ophthalmic Drugs)   No current facility-administered medications for this visit. (Ophthalmic Drugs)   Current Outpatient Medications (Other)  Medication Sig  . atorvastatin (LIPITOR) 10 MG tablet TK 1 T PO QD  . diclofenac sodium (VOLTAREN) 1 % GEL Apply 2 g topically 4 (four) times daily. Rub into affected area of foot 2 to 4 times daily  . hydrochlorothiazide (HYDRODIURIL) 25 MG tablet TK 1 T PO QD   No current facility-administered medications for this visit. (Other)      REVIEW  OF SYSTEMS: ROS    Positive for: Eyes   Negative for: Constitutional, Gastrointestinal, Neurological, Skin, Genitourinary, Musculoskeletal, HENT, Endocrine, Cardiovascular, Respiratory, Psychiatric, Allergic/Imm, Heme/Lymph   Last edited by Doneen Poisson on 03/21/2020  8:20 AM. (History)       ALLERGIES No Known Allergies  PAST MEDICAL HISTORY History reviewed. No pertinent past medical history. History reviewed. No pertinent surgical history.  FAMILY HISTORY Family History  Problem Relation Age of Onset  . Breast cancer Neg Hx     SOCIAL HISTORY Social History   Tobacco Use  . Smoking status: Unknown If Ever Smoked  . Smokeless tobacco: Never Used  Substance Use Topics  . Alcohol use: Not on file  . Drug use: Not on file         OPHTHALMIC EXAM:  Base Eye Exam    Visual Acuity (Snellen - Linear)      Right Left   Dist cc 20/50 -1 20/40 -1   Dist ph cc NI 20/25 +1   Correction: Glasses       Tonometry (Tonopen, 8:35 AM)      Right Left   Pressure 15 14       Pupils      Dark Light Shape React APD   Right 3 2 Round Brisk 0   Left 3 2 Round Brisk  0       Visual Fields      Left Right    Full Full       Extraocular Movement      Right Left    Full Full       Neuro/Psych    Oriented x3: Yes   Mood/Affect: Normal       Dilation    Both eyes: 1.0% Mydriacyl, 2.5% Phenylephrine @ 8:35 AM        Slit Lamp and Fundus Exam    Slit Lamp Exam      Right Left   Lids/Lashes Normal Normal   Conjunctiva/Sclera Mild Melanosis Mild Melanosis   Cornea Trace Punctate epithelial erosions, arcus Trace Punctate epithelial erosions, arcus   Anterior Chamber Deep and quiet Deep and quiet   Iris Round and dilated Round and dilated   Lens 2+ Nuclear sclerosis, 2+ Cortical cataract 2+ Nuclear sclerosis, 2+ Cortical cataract   Vitreous Vitreous syneresis Vitreous syneresis       Fundus Exam      Right Left   Disc Pink and Sharp Pink and Sharp   C/D  Ratio 0.4 0.4   Macula Blunted foveal reflex, ERM with nasal striea  good foveal reflex, focal ERM with stiae SN mac   Vessels attenuated, Tortuous, mild Copper wiring attenuated, Tortuous, mild Copper wiring   Periphery Attached, mild pigmented cystoid degeneration Attached, mild pigmented cystoid degeneration        Refraction    Wearing Rx      Sphere Cylinder Axis   Right -4.25 Sphere    Left -4.50 +0.50 175   Age: 57 yr       Manifest Refraction      Sphere Cylinder Axis Dist VA   Right -5.00 +0.75 170 20/40-1   Left -5.00 +0.75 180 20/20-2          IMAGING AND PROCEDURES  Imaging and Procedures for 03/21/2020  OCT, Retina - OU - Both Eyes       Right Eye Quality was good. Central Foveal Thickness: 416. Progression has no prior data. Findings include abnormal foveal contour, no IRF, no SRF, epiretinal membrane, macular pucker.   Left Eye Quality was good. Central Foveal Thickness: 245. Progression has no prior data. Findings include no IRF, no SRF, normal foveal contour, vitreomacular adhesion , epiretinal membrane, macular pucker (Focal ERM with pucker greatest ST macula).   Notes *Images captured and stored on drive  Diagnosis / Impression:  +ERM with pucker OU (OD>OS)  Clinical management:  See below  Abbreviations: NFP - Normal foveal profile. CME - cystoid macular edema. PED - pigment epithelial detachment. IRF - intraretinal fluid. SRF - subretinal fluid. EZ - ellipsoid zone. ERM - epiretinal membrane. ORA - outer retinal atrophy. ORT - outer retinal tubulation. SRHM - subretinal hyper-reflective material. IRHM - intraretinal hyper-reflective material                 ASSESSMENT/PLAN:    ICD-10-CM   1. Epiretinal membrane (ERM) of both eyes  H35.373   2. Retinal edema  H35.81 OCT, Retina - OU - Both Eyes  3. Essential hypertension  I10   4. Hypertensive retinopathy of both eyes  H35.033   5. Combined forms of age-related cataract of both eyes   H25.813     1,2. Epiretinal membrane, OU (OD>OS)  - The natural history, anatomy, potential for loss of vision, and treatment options including vitrectomy techniques and the complications of endophthalmitis, retinal detachment,  vitreous hemorrhage, cataract progression and permanent vision loss discussed with the patient. - BCVA OD: 20/40; OS: 20/20 (MRx) - pt reports generalized blur, mild metamorphopsia OD - OS with focal noncentral ERM w/ pucker, sup nasal macula - recommend 25g PPV w/ membrane peel and gas OD under general anesthesia; monitor OS - pt wishes to proceed with surgery OD next week (11.18.21)  - RBA of procedure discussed, questions answered  - informed consent obtained and signed - case scheduled - pt will need pre op COVID test and medical clearance from PCP  - f/u for POV1 -- Friday 11.19.21  3,4. Hypertensive retinopathy OU - discussed importance of tight BP control - monitor  5. Mixed Cataract OU - The symptoms of cataract, surgical options, and treatments and risks were discussed with patient. - discussed diagnosis and progression, specifically lkely progression of cataract OD post vitrectomy w/ gas - monitor   Ophthalmic Meds Ordered this visit:  No orders of the defined types were placed in this encounter.      Return in about 8 days (around 03/29/2020) for f/u ERM OD, POV, DFE.  There are no Patient Instructions on file for this visit.   Explained the diagnoses, plan, and follow up with the patient and they expressed understanding.  Patient expressed understanding of the importance of proper follow up care.   This document serves as a record of services personally performed by Gardiner Sleeper, MD, PhD. It was created on their behalf by Leonie Douglas, an ophthalmic technician. The creation of this record is the provider's dictation and/or activities during the visit.    Electronically signed by: Leonie Douglas COA, 03/21/20  11:17 PM   This document  serves as a record of services personally performed by Gardiner Sleeper, MD, PhD. It was created on their behalf by San Jetty. Owens Shark, OA an ophthalmic technician. The creation of this record is the provider's dictation and/or activities during the visit.    Electronically signed by: San Jetty. Owens Shark, [place] 11.11.2021 11:17 PM  Gardiner Sleeper, M.D., Ph.D. Diseases & Surgery of the Retina and Vitreous [facility]  I have reviewed the above documentation for accuracy and completeness, and I agree with the above. Gardiner Sleeper, M.D., Ph.D. 03/21/20 11:17 PM  Abbreviations: M myopia (nearsighted); A astigmatism; H hyperopia (farsighted); P presbyopia; Mrx spectacle prescription;  CTL contact lenses; OD right eye; OS left eye; OU both eyes  XT exotropia; ET esotropia; PEK punctate epithelial keratitis; PEE punctate epithelial erosions; DES dry eye syndrome; MGD meibomian gland dysfunction; ATs artificial tears; PFAT's preservative free artificial tears; [place] nuclear sclerotic cataract; PSC posterior subcapsular cataract; ERM epi-retinal membrane; PVD posterior vitreous detachment; RD retinal detachment; DM diabetes mellitus; DR diabetic retinopathy; NPDR non-proliferative diabetic retinopathy; PDR proliferative diabetic retinopathy; CSME clinically significant macular edema; DME diabetic macular edema; dbh dot blot hemorrhages; CWS cotton wool spot; POAG primary open angle glaucoma; C/D cup-to-disc ratio; HVF humphrey visual field; GVF goldmann visual field; OCT optical coherence tomography; IOP intraocular pressure; BRVO Branch retinal vein occlusion; CRVO central retinal vein occlusion; CRAO central retinal artery occlusion; BRAO branch retinal artery occlusion; RT retinal tear; SB scleral buckle; PPV pars plana vitrectomy; VH Vitreous hemorrhage; PRP panretinal laser photocoagulation; IVK intravitreal kenalog; VMT vitreomacular traction; MH Macular hole;  NVD neovascularization of the  disc; NVE neovascularization elsewhere; AREDS age related eye disease study; ARMD age related macular degeneration; POAG primary open angle glaucoma; EBMD epithelial/anterior basement membrane dystrophy; ACIOL anterior  chamber intraocular lens; IOL intraocular lens; PCIOL posterior chamber intraocular lens; Phaco/IOL phacoemulsification with intraocular lens placement; [place] photorefractive keratectomy; LASIK laser assisted in situ keratomileusis; HTN hypertension; DM diabetes mellitus; COPD chronic obstructive pulmonary disease

## 2020-03-21 ENCOUNTER — Ambulatory Visit (INDEPENDENT_AMBULATORY_CARE_PROVIDER_SITE_OTHER): Payer: Federal, State, Local not specified - PPO | Admitting: Ophthalmology

## 2020-03-21 ENCOUNTER — Encounter (INDEPENDENT_AMBULATORY_CARE_PROVIDER_SITE_OTHER): Payer: Self-pay | Admitting: Ophthalmology

## 2020-03-21 ENCOUNTER — Other Ambulatory Visit: Payer: Self-pay

## 2020-03-21 DIAGNOSIS — H35033 Hypertensive retinopathy, bilateral: Secondary | ICD-10-CM

## 2020-03-21 DIAGNOSIS — H35373 Puckering of macula, bilateral: Secondary | ICD-10-CM

## 2020-03-21 DIAGNOSIS — I10 Essential (primary) hypertension: Secondary | ICD-10-CM | POA: Diagnosis not present

## 2020-03-21 DIAGNOSIS — H3581 Retinal edema: Secondary | ICD-10-CM

## 2020-03-21 DIAGNOSIS — H25813 Combined forms of age-related cataract, bilateral: Secondary | ICD-10-CM

## 2020-03-25 NOTE — H&P (Signed)
Teresa Fernandez is an 64 y.o. female.    Chief Complaint: epiretinal membrane, RIGHT EYE  HPI: Pt reports 7 mo history of dec vision OD. On dilated exam, was noted to have a visually significant epiretinal membrane of the right eye. After a discussion of the findings, prognosis, treatment options, and risks, benefits and alternatives to surgery, the patient elected to proceed with surgical removal of the epiretinal membrane -- 25g PPV w/ membrane peel and gas, OD, under general anesthesia.  No past medical history on file.  No past surgical history on file.  Family History  Problem Relation Age of Onset  . Breast cancer Neg Hx    Social History:  has an unknown smoking status. She has never used smokeless tobacco. No history on file for alcohol use and drug use.  Allergies: No Known Allergies  No medications prior to admission.    Review of systems otherwise negative  There were no vitals taken for this visit.  Physical exam: Mental status: oriented x3. Eyes: See eye exam associated with this date of surgery Ears, Nose, Throat: within normal limits Neck: Within Normal limits General: within normal limits Chest: Within normal limits Breast: deferred Heart: Within normal limits Abdomen: Within normal limits GU: deferred Extremities: within normal limits Skin: within normal limits  Assessment/Plan 1. Epiretinal membrane, RIGHT EYE 2. Cataract OU  Plan: To [facility] for 25g PPV w/ membrane peel and gas, RIGHT EYE, under general anesthesia - case scheduled for Thursday 11.18.21, 1130 am, [facility] OR 08  Karie Chimera, M.D., Ph.D. Vitreoretinal Surgeon [facility]

## 2020-03-26 ENCOUNTER — Other Ambulatory Visit (HOSPITAL_COMMUNITY)
Admission: RE | Admit: 2020-03-26 | Discharge: 2020-03-26 | Disposition: A | Discharge status: Home / Self Care | Payer: Federal, State, Local not specified - PPO | Source: Ambulatory Visit | Attending: Ophthalmology | Admitting: Ophthalmology

## 2020-03-26 DIAGNOSIS — Z20822 Contact with and (suspected) exposure to covid-19: Secondary | ICD-10-CM | POA: Insufficient documentation

## 2020-03-26 DIAGNOSIS — H35371 Puckering of macula, right eye: Secondary | ICD-10-CM | POA: Diagnosis not present

## 2020-03-26 DIAGNOSIS — H547 Unspecified visual loss: Secondary | ICD-10-CM | POA: Diagnosis not present

## 2020-03-26 DIAGNOSIS — Z01812 Encounter for preprocedural laboratory examination: Secondary | ICD-10-CM | POA: Insufficient documentation

## 2020-03-26 LAB — SARS CORONAVIRUS 2 (TAT 6-24 HRS): SARS Coronavirus 2: NEGATIVE

## 2020-03-27 ENCOUNTER — Encounter (HOSPITAL_COMMUNITY): Payer: Self-pay | Admitting: Ophthalmology

## 2020-03-27 ENCOUNTER — Other Ambulatory Visit: Payer: Self-pay

## 2020-03-27 NOTE — Progress Notes (Signed)
Patient denies shortness of breath, fever, cough or chest pain.  PCP - Dr Darrow Bussing at [facility] Cardiologist - n/a  Chest x-ray - n/a EKG - DOS 03/28/20 Stress Test - n/a ECHO - n/a Cardiac Cath - n/a  Sleep apnea - Yes CPAP - uses cpap nightly  Anesthesia - Yes, spoke to [place], [place] regarding patient's heart murmur (heard by PCP per patient).  PCP never referred patient to see cardiologist.  STOP now taking any Aspirin (unless otherwise instructed by your surgeon), Aleve, Naproxen, Ibuprofen, Motrin, Advil, Goody's, BC's, all herbal medications, fish oil, and all vitamins.   Coronavirus Screening Covid test on 03/26/20 was negative.  Patient verbalized understanding of instructions that were given via phone.

## 2020-03-27 NOTE — Progress Notes (Signed)
[facility] Clinic Note  03/29/2020     CHIEF COMPLAINT Patient presents for Post-op Follow-up   HISTORY OF PRESENT ILLNESS: Teresa Fernandez is a 64 y.o. female who presents to the clinic today for:   HPI    Post-op Follow-up    In right eye.  Discomfort includes none.  Vision is worse, is blurred at distance and is blurred at near.  I, the attending physician,  performed the HPI with the patient and updated documentation appropriately.          Comments    64 y/o female pt here for 1 day POV s/p PPV/MP OD 11.18.21.  Pt did okay sleeping last night.  OD was a little sore immediately following sx, but has since eased off.  VA very blurred OD due to gas bubble.  No change in [place] OS.  Denies pain, FOL, floaters.  Has not yet started po gtts.  Patch removed in office this a.m.       Last edited by Rennis Chris, MD on 03/29/2020  8:13 AM. (History)    pt states her eye feels okay this morning, she was able to sleep in the chair she got from [employer] last night  Referring physician: Fredrich Birks, OD [address] [place] [address]  HISTORICAL INFORMATION:   Selected notes from the MEDICAL RECORD NUMBER Referred by Dr. Fredrich Birks LEE:  Ocular Hx- PMH-    CURRENT MEDICATIONS: No current outpatient medications on file. (Ophthalmic Drugs)   No current facility-administered medications for this visit. (Ophthalmic Drugs)   Current Outpatient Medications (Other)  Medication Sig  . acetaminophen (TYLENOL) 325 MG tablet Take 325-650 mg by mouth every 6 (six) hours as needed for moderate pain or headache.  Marland Kitchen atorvastatin (LIPITOR) 20 MG tablet Take 20 mg by mouth daily.  . Calcium Carbonate-Vit D-Min (CALCIUM 1200 PO) Take 1 tablet by mouth daily.  . cetirizine (ZYRTEC) 10 MG tablet Take 10 mg by mouth daily as needed for allergies.  Marland Kitchen ECHINACEA PO Take 2 tablets by mouth daily as needed (immune support).  . fluocinonide ointment  (LIDEX) 0.05 % Apply 1 application topically daily as needed (eczema).   . fluticasone (FLONASE) 50 MCG/ACT nasal spray Place 1 spray into both nostrils daily as needed for allergies or rhinitis.  . hydrochlorothiazide (HYDRODIURIL) 25 MG tablet Take 25 mg by mouth daily.   Marland Kitchen MAGNESIUM PO Take 1 tablet by mouth daily. With ashwagandha  . Multiple Vitamin (MULTIVITAMIN WITH MINERALS) TABS tablet Take 1 tablet by mouth daily.  . Potassium 99 MG TABS Take 99 mg by mouth every other day.   No current facility-administered medications for this visit. (Other)      REVIEW OF SYSTEMS: ROS    Positive for: Gastrointestinal, Cardiovascular, Eyes, Respiratory   Negative for: Constitutional, Neurological, Skin, Genitourinary, Musculoskeletal, HENT, Endocrine, Psychiatric, Allergic/Imm, Heme/Lymph   Last edited by Celine Mans, COA on 03/29/2020  8:07 AM. (History)       ALLERGIES No Known Allergies  PAST MEDICAL HISTORY Past Medical History:  Diagnosis Date  . Cataract    Mixed form OU  . GERD (gastroesophageal reflux disease)    occasional - diet controlled, no meds  . Heart murmur    heard by PCP per pt on 03/27/20, PCP never referred pt to see cardiologist  . HLD (hyperlipidemia)   . Hypertension   . Hypertensive retinopathy    OU  . Seasonal allergies   .  Sleep apnea    uses cpap nightly   Past Surgical History:  Procedure Laterality Date  . COLONOSCOPY    . EYE SURGERY Right 03/28/2020   PPV+MP - Dr. Rennis Chris  . GAS INSERTION Right 03/28/2020   Procedure: INSERTION OF GAS;  Surgeon: Rennis Chris, MD;  Location: [facility] OR;  Service: Ophthalmology;  Laterality: Right;  . GAS/FLUID EXCHANGE Right 03/28/2020   Procedure: GAS/FLUID EXCHANGE;  Surgeon: Rennis Chris, MD;  Location: [facility] OR;  Service: Ophthalmology;  Laterality: Right;  . MEMBRANE PEEL Right 03/28/2020   Procedure: MEMBRANE PEEL;  Surgeon: Rennis Chris, MD;  Location: [facility] OR;  Service: Ophthalmology;   Laterality: Right;  . PARS PLANA VITRECTOMY Right 03/28/2020   Procedure: PARS PLANA VITRECTOMY WITH 25 GAUGE;  Surgeon: Rennis Chris, MD;  Location[facility] OR;  Service: Ophthalmology;  Laterality: Right;  . SHOULDER SURGERY Right    remove bone spur  . WISDOM TOOTH EXTRACTION      FAMILY HISTORY Family History  Problem Relation Age of Onset  . Breast cancer Neg Hx     SOCIAL HISTORY Social History   Tobacco Use  . Smoking status: Former Smoker    Years: 5.00    Types: Cigarettes  . Smokeless tobacco: Never Used  . Tobacco comment: smoked from age [age] yrs old   Vaping Use  . Vaping Use: Never used  Substance Use Topics  . Alcohol use: Never  . Drug use: Never         OPHTHALMIC EXAM:  Base Eye Exam    Visual Acuity (Snellen - Linear)      Right Left   Dist [place] HM    Dist ph [place] NI        Tonometry (Tonopen, 8:10 AM)      Right Left   Pressure 16 Def       Pupils      Dark Light Shape React APD   Right 5 5 Round Minimal None   Left 3 2 Round Brisk None  Pharm dil OD       Visual Fields (Counting fingers)      Left Right    Full    Restrictions  Total superior temporal, inferior temporal, superior nasal, inferior nasal deficiencies       Extraocular Movement      Right Left    Full, Ortho Full, Ortho       Neuro/Psych    Oriented x3: Yes   Mood/Affect: Normal       Dilation    Right eye: 1.0% Mydriacyl, 2.5% Phenylephrine @ 8:10 AM        Slit Lamp and Fundus Exam    Slit Lamp Exam      Right Left   Lids/Lashes Normal Normal   Conjunctiva/Sclera Subconjunctival hemorrhage, sutures intact Mild Melanosis   Cornea Trace Punctate epithelial erosions, arcus Trace Punctate epithelial erosions, arcus   Anterior Chamber Deep and quiet Deep and quiet   Iris Round and dilated Round and dilated   Lens 2+ Nuclear sclerosis, 2+ Cortical cataract, trace PC feathering 2+ Nuclear sclerosis, 2+ Cortical cataract   Vitreous post vitrectomy, good gas fill  Vitreous syneresis       Fundus Exam      Right Left   Disc Pink and Sharp    C/D Ratio 0.4 0.4   Macula flat under gas, ERM gone    Vessels attenuated, Tortuous, mild Copper wiring    Periphery Attached, mild pigmented cystoid degeneration  IMAGING AND PROCEDURES  Imaging and Procedures for 03/29/2020           ASSESSMENT/PLAN:    ICD-10-CM   1. Epiretinal membrane (ERM) of both eyes  H35.373   2. Retinal edema  H35.81   3. Essential hypertension  I10   4. Hypertensive retinopathy of both eyes  H35.033   5. Combined forms of age-related cataract of both eyes  H25.813     1,2. Epiretinal membrane, OU (OD>OS)  - initial / pre-op BCVA OD: 20/40; OS: 20/20 (MRx) - pt reports generalized blur, mild metamorphopsia OD - OS with focal noncentral ERM w/ pucker, sup nasal macula  - now POD1 s/p PPV/TissueBlue/MP/20% SF6 OD, 11.18.21             - doing well this morning             - ERM gone and good gas bubble in place             - IOP 16             - start   PF 4x/day OD                         zymaxid QID OD    Brimonidine QDaily                         Atropine BID OD                          PSO ung QID OD              - cont face down positioning x3 days then can decrease positioning to 50% of time; avoid laying flat on back              - eye shield when sleeping              - post op drop and positioning instructions reviewed  - tylenol/ibuprofen for pain - f/u Wednesday, November 24, DFE/OCT  3,4. Hypertensive retinopathy OU - discussed importance of tight BP control - monitor  5. Mixed Cataract OU - The symptoms of cataract, surgical options, and treatments and risks were discussed with patient. - discussed diagnosis and progression, specifically lkely progression of cataract OD post vitrectomy w/ gas - monitor   Ophthalmic Meds Ordered this visit:  No orders of the defined types were placed in this encounter.      Return in about 5  days (around 04/03/2020) for ERM OD, POV.  There are no Patient Instructions on file for this visit.   Explained the diagnoses, plan, and follow up with the patient and they expressed understanding.  Patient expressed understanding of the importance of proper follow up care.   This document serves as a record of services personally performed by Karie ChimeraBrian G. Michelyn Scullin, MD, PhD. It was created on their behalf by Glee ArvinAmanda J. Manson PasseyBrown, OA an ophthalmic technician. The creation of this record is the provider's dictation and/or activities during the visit.    Electronically signed by: Glee ArvinAmanda J. Manson PasseyBrown, [place] 11.17.2021 1:31 PM  Karie ChimeraBrian G. Elona Yinger, M.D., Ph.D. Diseases & Surgery of the Retina and Vitreous [facility]  I have reviewed the above documentation for accuracy and completeness, and I agree with the above. Karie ChimeraBrian G. Tangia Pinard, M.D., Ph.D. 03/29/20 1:31 PM   Abbreviations: M myopia (nearsighted); A  astigmatism; H hyperopia (farsighted); P presbyopia; Mrx spectacle prescription;  CTL contact lenses; OD right eye; OS left eye; OU both eyes  XT exotropia; ET esotropia; PEK punctate epithelial keratitis; PEE punctate epithelial erosions; DES dry eye syndrome; MGD meibomian gland dysfunction; ATs artificial tears; PFAT's preservative free artificial tears; [place] nuclear sclerotic cataract; PSC posterior subcapsular cataract; ERM epi-retinal membrane; PVD posterior vitreous detachment; RD retinal detachment; DM diabetes mellitus; DR diabetic retinopathy; NPDR non-proliferative diabetic retinopathy; PDR proliferative diabetic retinopathy; CSME clinically significant macular edema; DME diabetic macular edema; dbh dot blot hemorrhages; CWS cotton wool spot; POAG primary open angle glaucoma; C/D cup-to-disc ratio; HVF humphrey visual field; GVF goldmann visual field; OCT optical coherence tomography; IOP intraocular pressure; BRVO Branch retinal vein occlusion; CRVO central retinal vein occlusion; CRAO  central retinal artery occlusion; BRAO branch retinal artery occlusion; RT retinal tear; SB scleral buckle; PPV pars plana vitrectomy; VH Vitreous hemorrhage; PRP panretinal laser photocoagulation; IVK intravitreal kenalog; VMT vitreomacular traction; MH Macular hole;  NVD neovascularization of the disc; NVE neovascularization elsewhere; AREDS age related eye disease study; ARMD age related macular degeneration; POAG primary open angle glaucoma; EBMD epithelial/anterior basement membrane dystrophy; ACIOL anterior chamber intraocular lens; IOL intraocular lens; PCIOL posterior chamber intraocular lens; Phaco/IOL phacoemulsification with intraocular lens placement; PRK photorefractive keratectomy; LASIK laser assisted in situ keratomileusis; HTN hypertension; DM diabetes mellitus; COPD chronic obstructive pulmonary disease

## 2020-03-28 ENCOUNTER — Ambulatory Visit (HOSPITAL_COMMUNITY): Payer: Federal, State, Local not specified - PPO | Admitting: Vascular Surgery

## 2020-03-28 ENCOUNTER — Ambulatory Visit (HOSPITAL_COMMUNITY)
Admission: RE | Admit: 2020-03-28 | Discharge: 2020-03-28 | Disposition: A | Discharge status: Home / Self Care | Payer: Federal, State, Local not specified - PPO | Attending: Ophthalmology | Admitting: Ophthalmology

## 2020-03-28 ENCOUNTER — Encounter (HOSPITAL_COMMUNITY)
Admission: RE | Disposition: A | Discharge status: Home / Self Care | Payer: Self-pay | Source: Home / Self Care | Attending: Ophthalmology

## 2020-03-28 ENCOUNTER — Other Ambulatory Visit: Payer: Self-pay

## 2020-03-28 ENCOUNTER — Encounter (HOSPITAL_COMMUNITY): Payer: Self-pay | Admitting: Ophthalmology

## 2020-03-28 DIAGNOSIS — H35371 Puckering of macula, right eye: Secondary | ICD-10-CM | POA: Diagnosis not present

## 2020-03-28 DIAGNOSIS — Z20822 Contact with and (suspected) exposure to covid-19: Secondary | ICD-10-CM | POA: Diagnosis not present

## 2020-03-28 DIAGNOSIS — I10 Essential (primary) hypertension: Secondary | ICD-10-CM | POA: Diagnosis not present

## 2020-03-28 DIAGNOSIS — E785 Hyperlipidemia, unspecified: Secondary | ICD-10-CM | POA: Diagnosis not present

## 2020-03-28 DIAGNOSIS — H547 Unspecified visual loss: Secondary | ICD-10-CM | POA: Diagnosis not present

## 2020-03-28 DIAGNOSIS — K219 Gastro-esophageal reflux disease without esophagitis: Secondary | ICD-10-CM | POA: Diagnosis not present

## 2020-03-28 HISTORY — DX: Other seasonal allergic rhinitis: J30.2

## 2020-03-28 HISTORY — PX: EYE SURGERY: SHX253

## 2020-03-28 HISTORY — PX: MEMBRANE PEEL: SHX5967

## 2020-03-28 HISTORY — DX: Hyperlipidemia, unspecified: E78.5

## 2020-03-28 HISTORY — PX: GAS INSERTION: SHX5336

## 2020-03-28 HISTORY — DX: Sleep apnea, unspecified: G47.30

## 2020-03-28 HISTORY — PX: GAS/FLUID EXCHANGE: SHX5334

## 2020-03-28 HISTORY — DX: Essential (primary) hypertension: I10

## 2020-03-28 HISTORY — DX: Cardiac murmur, unspecified: R01.1

## 2020-03-28 HISTORY — DX: Gastro-esophageal reflux disease without esophagitis: K21.9

## 2020-03-28 HISTORY — PX: PARS PLANA VITRECTOMY: SHX2166

## 2020-03-28 LAB — CBC
HCT: 43.5 % (ref 36.0–46.0)
Hemoglobin: 13.8 g/dL (ref 12.0–15.0)
MCH: 29.1 pg (ref 26.0–34.0)
MCHC: 31.7 g/dL (ref 30.0–36.0)
MCV: 91.6 fL (ref 80.0–100.0)
Platelets: 103 10*3/uL — ABNORMAL LOW (ref 150–400)
RBC: 4.75 MIL/uL (ref 3.87–5.11)
RDW: 13.7 % (ref 11.5–15.5)
WBC: 6.1 10*3/uL (ref 4.0–10.5)
nRBC: 0 % (ref 0.0–0.2)

## 2020-03-28 LAB — BASIC METABOLIC PANEL
Anion gap: 11 (ref 5–15)
BUN: 11 mg/dL (ref 8–23)
CO2: 22 mmol/L (ref 22–32)
Calcium: 9.1 mg/dL (ref 8.9–10.3)
Chloride: 107 mmol/L (ref 98–111)
Creatinine, Ser: 1.02 mg/dL — ABNORMAL HIGH (ref 0.44–1.00)
GFR, Estimated: >60 mL/min (ref >60)
Glucose, Bld: 118 mg/dL — ABNORMAL HIGH (ref 70–99)
Potassium: 5.5 mmol/L — ABNORMAL HIGH (ref 3.5–5.1)
Sodium: 140 mmol/L (ref 135–145)

## 2020-03-28 SURGERY — PARS PLANA VITRECTOMY WITH 25 GAUGE
Anesthesia: General | Site: Eye | Laterality: Right

## 2020-03-28 MED ORDER — CARBACHOL 0.01 % IO SOLN
INTRAOCULAR | Status: AC
  Filled 2020-03-28: qty 1.5

## 2020-03-28 MED ORDER — DEXAMETHASONE SODIUM PHOSPHATE 10 MG/ML IJ SOLN
INTRAMUSCULAR | Status: AC
  Filled 2020-03-28: qty 1

## 2020-03-28 MED ORDER — FENTANYL CITRATE (PF) 100 MCG/2ML IJ SOLN: 25.0000 ug | INTRAMUSCULAR | Status: DC | PRN

## 2020-03-28 MED ORDER — ATROPINE SULFATE 1 % OP SOLN
OPHTHALMIC | Status: DC | PRN
  Administered 2020-03-28: 1 [drp] via OPHTHALMIC

## 2020-03-28 MED ORDER — ATROPINE SULFATE 1 % OP SOLN
1.0000 [drp] | OPHTHALMIC | Status: AC | PRN
  Administered 2020-03-28 (×2): 1 [drp] via OPHTHALMIC
  Filled 2020-03-28: qty 5

## 2020-03-28 MED ORDER — TRIAMCINOLONE ACETONIDE 40 MG/ML IJ SUSP
INTRAMUSCULAR | Status: AC
  Filled 2020-03-28: qty 5

## 2020-03-28 MED ORDER — CELECOXIB 200 MG PO CAPS
ORAL_CAPSULE | ORAL | Status: AC
  Administered 2020-03-28: 200 mg via ORAL
  Filled 2020-03-28: qty 1

## 2020-03-28 MED ORDER — ORAL CARE MOUTH RINSE: 15.0000 mL | Freq: Once | OROMUCOSAL | Status: AC

## 2020-03-28 MED ORDER — PHENYLEPHRINE HCL 10 % OP SOLN
OPHTHALMIC | Status: AC
  Administered 2020-03-28: 1 [drp] via OPHTHALMIC
  Filled 2020-03-28: qty 5

## 2020-03-28 MED ORDER — PROPARACAINE HCL 0.5 % OP SOLN
1.0000 [drp] | OPHTHALMIC | Status: AC | PRN
  Administered 2020-03-28 (×2): 1 [drp] via OPHTHALMIC

## 2020-03-28 MED ORDER — INDOCYANINE GREEN 25 MG IV SOLR
INTRAVENOUS | Status: AC
  Filled 2020-03-28: qty 10

## 2020-03-28 MED ORDER — ACETAMINOPHEN 500 MG PO TABS: 1000.0000 mg | ORAL_TABLET | Freq: Once | ORAL | Status: AC

## 2020-03-28 MED ORDER — BRIMONIDINE TARTRATE 0.2 % OP SOLN
OPHTHALMIC | Status: AC
  Filled 2020-03-28: qty 5

## 2020-03-28 MED ORDER — TROPICAMIDE 1 % OP SOLN
1.0000 [drp] | OPHTHALMIC | Status: AC | PRN
  Administered 2020-03-28 (×2): 1 [drp] via OPHTHALMIC

## 2020-03-28 MED ORDER — STERILE WATER FOR INJECTION IJ SOLN
INTRAMUSCULAR | Status: AC
  Filled 2020-03-28: qty 10

## 2020-03-28 MED ORDER — POLYMYXIN B SULFATE 500000 UNITS IJ SOLR
INTRAMUSCULAR | Status: AC
  Filled 2020-03-28: qty 500000

## 2020-03-28 MED ORDER — ROCURONIUM BROMIDE 10 MG/ML (PF) SYRINGE
PREFILLED_SYRINGE | INTRAVENOUS | Status: AC
  Filled 2020-03-28: qty 10

## 2020-03-28 MED ORDER — GATIFLOXACIN 0.5 % OP SOLN
OPHTHALMIC | Status: AC
  Filled 2020-03-28: qty 2.5

## 2020-03-28 MED ORDER — FENTANYL CITRATE (PF) 250 MCG/5ML IJ SOLN
INTRAMUSCULAR | Status: AC
  Filled 2020-03-28: qty 5

## 2020-03-28 MED ORDER — CEFTAZIDIME 1 G IJ SOLR
INTRAMUSCULAR | Status: AC
  Filled 2020-03-28: qty 1

## 2020-03-28 MED ORDER — PHENYLEPHRINE HCL 10 % OP SOLN
1.0000 [drp] | OPHTHALMIC | Status: AC | PRN
  Administered 2020-03-28 (×2): 1 [drp] via OPHTHALMIC

## 2020-03-28 MED ORDER — ONDANSETRON HCL 4 MG/2ML IJ SOLN
INTRAMUSCULAR | Status: AC
  Filled 2020-03-28: qty 2

## 2020-03-28 MED ORDER — BACITRACIN-POLYMYXIN B 500-10000 UNIT/GM OP OINT
TOPICAL_OINTMENT | OPHTHALMIC | Status: AC
  Filled 2020-03-28: qty 3.5

## 2020-03-28 MED ORDER — DEXTROSE 5 % IV SOLN
INTRAVENOUS | Status: AC
  Filled 2020-03-28: qty 100

## 2020-03-28 MED ORDER — BRILLIANT BLUE G 0.025 % IO SOSY
0.5000 mL | PREFILLED_SYRINGE | INTRAOCULAR | Status: DC
  Filled 2020-03-28: qty 0.5

## 2020-03-28 MED ORDER — DEXAMETHASONE SODIUM PHOSPHATE 10 MG/ML IJ SOLN
INTRAMUSCULAR | Status: DC | PRN
  Administered 2020-03-28: 10 mg via INTRAVENOUS

## 2020-03-28 MED ORDER — CHLORHEXIDINE GLUCONATE 0.12 % MT SOLN
15.0000 mL | Freq: Once | OROMUCOSAL | Status: AC
  Administered 2020-03-28: 15 mL via OROMUCOSAL
  Filled 2020-03-28: qty 15

## 2020-03-28 MED ORDER — BSS IO SOLN
INTRAOCULAR | Status: AC
  Filled 2020-03-28: qty 15

## 2020-03-28 MED ORDER — BALANCED SALT IO SOLN
INTRAOCULAR | Status: DC | PRN
  Administered 2020-03-28: 15 mL via INTRAOCULAR

## 2020-03-28 MED ORDER — NA CHONDROIT SULF-NA HYALURON 40-30 MG/ML IO SOSY
INTRAOCULAR | Status: AC
  Filled 2020-03-28: qty 1

## 2020-03-28 MED ORDER — ATROPINE SULFATE 1 % OP SOLN
OPHTHALMIC | Status: AC
  Administered 2020-03-28: 1 [drp] via OPHTHALMIC
  Filled 2020-03-28: qty 5

## 2020-03-28 MED ORDER — SUGAMMADEX SODIUM 200 MG/2ML IV SOLN
INTRAVENOUS | Status: DC | PRN
  Administered 2020-03-28: 200 mg via INTRAVENOUS

## 2020-03-28 MED ORDER — NA CHONDROIT SULF-NA HYALURON 40-30 MG/ML IO SOSY
INTRAOCULAR | Status: DC | PRN
  Administered 2020-03-28: 0.5 mL via INTRAOCULAR

## 2020-03-28 MED ORDER — TOBRAMYCIN-DEXAMETHASONE 0.3-0.1 % OP OINT
TOPICAL_OINTMENT | OPHTHALMIC | Status: AC
  Filled 2020-03-28: qty 3.5

## 2020-03-28 MED ORDER — LIDOCAINE HCL (PF) 2 % IJ SOLN
INTRAMUSCULAR | Status: AC
  Filled 2020-03-28: qty 10

## 2020-03-28 MED ORDER — STERILE WATER FOR INJECTION IJ SOLN
INTRAMUSCULAR | Status: DC | PRN
  Administered 2020-03-28: 20 mL

## 2020-03-28 MED ORDER — ROCURONIUM BROMIDE 10 MG/ML (PF) SYRINGE
PREFILLED_SYRINGE | INTRAVENOUS | Status: DC | PRN
  Administered 2020-03-28: 20 mg via INTRAVENOUS
  Administered 2020-03-28: 50 mg via INTRAVENOUS

## 2020-03-28 MED ORDER — BRIMONIDINE TARTRATE 0.2 % OP SOLN
OPHTHALMIC | Status: DC | PRN
  Administered 2020-03-28: 1 [drp] via OPHTHALMIC

## 2020-03-28 MED ORDER — BSS PLUS IO SOLN
INTRAOCULAR | Status: AC
  Filled 2020-03-28: qty 500

## 2020-03-28 MED ORDER — PROPOFOL 10 MG/ML IV BOLUS
INTRAVENOUS | Status: AC
  Filled 2020-03-28: qty 20

## 2020-03-28 MED ORDER — ATROPINE SULFATE 1 % OP SOLN
OPHTHALMIC | Status: AC
  Filled 2020-03-28: qty 5

## 2020-03-28 MED ORDER — PREDNISOLONE ACETATE 1 % OP SUSP
OPHTHALMIC | Status: AC
  Filled 2020-03-28: qty 5

## 2020-03-28 MED ORDER — EPINEPHRINE PF 1 MG/ML IJ SOLN
INTRAOCULAR | Status: DC | PRN
  Administered 2020-03-28: 500 mL

## 2020-03-28 MED ORDER — ACETAZOLAMIDE SODIUM 500 MG IJ SOLR
INTRAMUSCULAR | Status: AC
  Filled 2020-03-28: qty 500

## 2020-03-28 MED ORDER — SODIUM CHLORIDE (PF) 0.9 % IJ SOLN
INTRAMUSCULAR | Status: AC
  Filled 2020-03-28: qty 10

## 2020-03-28 MED ORDER — EPINEPHRINE PF 1 MG/ML IJ SOLN
INTRAMUSCULAR | Status: AC
  Filled 2020-03-28: qty 1

## 2020-03-28 MED ORDER — ACETAMINOPHEN 500 MG PO TABS
ORAL_TABLET | ORAL | Status: AC
  Administered 2020-03-28: 1000 mg via ORAL
  Filled 2020-03-28: qty 2

## 2020-03-28 MED ORDER — TROPICAMIDE 1 % OP SOLN
OPHTHALMIC | Status: AC
  Administered 2020-03-28: 1 [drp] via OPHTHALMIC
  Filled 2020-03-28: qty 15

## 2020-03-28 MED ORDER — TRIAMCINOLONE ACETONIDE 40 MG/ML IJ SUSP
INTRAMUSCULAR | Status: DC | PRN
  Administered 2020-03-28: 40 mg

## 2020-03-28 MED ORDER — ONDANSETRON HCL 4 MG/2ML IJ SOLN
INTRAMUSCULAR | Status: DC | PRN
  Administered 2020-03-28: 4 mg via INTRAVENOUS

## 2020-03-28 MED ORDER — BUPIVACAINE HCL (PF) 0.75 % IJ SOLN
INTRAMUSCULAR | Status: AC
  Filled 2020-03-28: qty 10

## 2020-03-28 MED ORDER — MIDAZOLAM HCL 2 MG/2ML IJ SOLN
INTRAMUSCULAR | Status: DC | PRN
  Administered 2020-03-28: 2 mg via INTRAVENOUS

## 2020-03-28 MED ORDER — DORZOLAMIDE HCL-TIMOLOL MAL 2-0.5 % OP SOLN
OPHTHALMIC | Status: DC | PRN
  Administered 2020-03-28: 1 [drp] via OPHTHALMIC

## 2020-03-28 MED ORDER — PROPOFOL 10 MG/ML IV BOLUS
INTRAVENOUS | Status: DC | PRN
  Administered 2020-03-28: 100 mg via INTRAVENOUS

## 2020-03-28 MED ORDER — MIDAZOLAM HCL 2 MG/2ML IJ SOLN
INTRAMUSCULAR | Status: AC
  Filled 2020-03-28: qty 2

## 2020-03-28 MED ORDER — PREDNISOLONE ACETATE 1 % OP SUSP
OPHTHALMIC | Status: DC | PRN
  Administered 2020-03-28: 1 [drp] via OPHTHALMIC

## 2020-03-28 MED ORDER — PROPARACAINE HCL 0.5 % OP SOLN
OPHTHALMIC | Status: AC
  Administered 2020-03-28: 1 [drp] via OPHTHALMIC
  Filled 2020-03-28: qty 15

## 2020-03-28 MED ORDER — FENTANYL CITRATE (PF) 250 MCG/5ML IJ SOLN
INTRAMUSCULAR | Status: DC | PRN
  Administered 2020-03-28 (×2): 50 ug via INTRAVENOUS

## 2020-03-28 MED ORDER — CELECOXIB 200 MG PO CAPS: 200.0000 mg | ORAL_CAPSULE | Freq: Once | ORAL | Status: AC

## 2020-03-28 MED ORDER — LIDOCAINE 2% (20 MG/ML) 5 ML SYRINGE
INTRAMUSCULAR | Status: AC
  Filled 2020-03-28: qty 5

## 2020-03-28 MED ORDER — BACITRACIN-POLYMYXIN B 500-10000 UNIT/GM OP OINT
TOPICAL_OINTMENT | OPHTHALMIC | Status: DC | PRN
  Administered 2020-03-28: 1 via OPHTHALMIC

## 2020-03-28 MED ORDER — LACTATED RINGERS IV SOLN: INTRAVENOUS | Status: DC

## 2020-03-28 MED ORDER — BRILLIANT BLUE G 0.025 % IO SOSY
PREFILLED_SYRINGE | INTRAOCULAR | Status: DC | PRN
  Administered 2020-03-28: 0.5 mL via INTRAVITREAL

## 2020-03-28 MED ORDER — GATIFLOXACIN 0.5 % OP SOLN OPTIME - NO CHARGE
OPHTHALMIC | Status: DC | PRN
  Administered 2020-03-28: 1 [drp] via OPHTHALMIC

## 2020-03-28 MED ORDER — LIDOCAINE HCL (PF) 4 % IJ SOLN
INTRAMUSCULAR | Status: AC
  Filled 2020-03-28: qty 5

## 2020-03-28 MED ORDER — DORZOLAMIDE HCL-TIMOLOL MAL 2-0.5 % OP SOLN
OPHTHALMIC | Status: AC
  Filled 2020-03-28: qty 10

## 2020-03-28 MED ORDER — LIDOCAINE 2% (20 MG/ML) 5 ML SYRINGE
INTRAMUSCULAR | Status: DC | PRN
  Administered 2020-03-28: 60 mg via INTRAVENOUS

## 2020-03-28 SURGICAL SUPPLY — 73 items
APL SWBSTK 6 STRL LF DISP (MISCELLANEOUS) ×4
APPLICATOR COTTON TIP 6 STRL (MISCELLANEOUS) ×4 IMPLANT
APPLICATOR COTTON TIP 6IN STRL (MISCELLANEOUS) ×8
BALL CTTN LRG ABS STRL LF (GAUZE/BANDAGES/DRESSINGS) ×3
BAND WRIST GAS GREEN (MISCELLANEOUS) IMPLANT
BLADE EYE CATARACT 19 1.4 BEAV (BLADE) IMPLANT
BNDG EYE OVAL (GAUZE/BANDAGES/DRESSINGS) ×2 IMPLANT
CABLE BIPOLOR RESECTION CORD (MISCELLANEOUS) ×2 IMPLANT
CANNULA ANT CHAM MAIN (OPHTHALMIC RELATED) IMPLANT
CANNULA FLEX TIP 25G (CANNULA) ×2 IMPLANT
CANNULA TROCAR 23 GA VLV (OPHTHALMIC) IMPLANT
CANNULA TROCAR 23G VLV (OPHTHALMIC) IMPLANT
CANNULA VLV SOFT TIP 25G (OPHTHALMIC) IMPLANT
CANNULA VLV SOFT TIP 25GA (OPHTHALMIC) ×2 IMPLANT
CLSR STERI-STRIP ANTIMIC 1/2X4 (GAUZE/BANDAGES/DRESSINGS) ×2 IMPLANT
COTTONBALL LRG STERILE PKG (GAUZE/BANDAGES/DRESSINGS) ×6 IMPLANT
COVER WAND RF STERILE (DRAPES) ×2 IMPLANT
DRAPE MICROSCOPE LEICA 46X105 (MISCELLANEOUS) ×2 IMPLANT
DRAPE OPHTHALMIC 77X100 STRL (CUSTOM PROCEDURE TRAY) ×2 IMPLANT
ERASER HMR WETFIELD 23G BP (MISCELLANEOUS) IMPLANT
FILTER BLUE MILLIPORE (MISCELLANEOUS) ×1 IMPLANT
FILTER STRAW FLUID ASPIR (MISCELLANEOUS) IMPLANT
FORCEPS GRIESHABER ILM 25G A (INSTRUMENTS) ×1 IMPLANT
GAS AUTO FILL CONSTEL (OPHTHALMIC) ×2
GAS AUTO FILL CONSTELLATION (OPHTHALMIC) IMPLANT
GAS IO SF6 125GM ISPAN CNSTL (MISCELLANEOUS) IMPLANT
GAS TANK CONSTELL (MISCELLANEOUS) ×2
GAS WRIST BAND GREEN (MISCELLANEOUS) ×2
GLOVE BIO SURGEON STRL SZ7.5 (GLOVE) ×4 IMPLANT
GLOVE BIOGEL M 7.0 STRL (GLOVE) ×2 IMPLANT
GOWN STRL REUS W/ TWL LRG LVL3 (GOWN DISPOSABLE) ×2 IMPLANT
GOWN STRL REUS W/ TWL XL LVL3 (GOWN DISPOSABLE) ×1 IMPLANT
GOWN STRL REUS W/TWL LRG LVL3 (GOWN DISPOSABLE) ×4
GOWN STRL REUS W/TWL XL LVL3 (GOWN DISPOSABLE) ×2
KIT BASIN OR (CUSTOM PROCEDURE TRAY) ×2 IMPLANT
KIT PERFLUORON PROCEDURE 5ML (MISCELLANEOUS) IMPLANT
LENS MACULAR ASPHERIC CONSTEL (OPHTHALMIC) IMPLANT
LENS VITRECTOMY FLAT OCLR DISP (MISCELLANEOUS) IMPLANT
LOOP FINESSE 25 GA (MISCELLANEOUS) IMPLANT
MICROPICK 25G (MISCELLANEOUS)
NDL 18GX1X1/2 (RX/OR ONLY) (NEEDLE) ×1 IMPLANT
NDL 25GX 5/8IN NON SAFETY (NEEDLE) ×3 IMPLANT
NDL HYPO 30X.5 LL (NEEDLE) ×2 IMPLANT
NDL PRECISIONGLIDE 27X1.5 (NEEDLE) IMPLANT
NEEDLE 18GX1X1/2 (RX/OR ONLY) (NEEDLE) ×2 IMPLANT
NEEDLE 25GX 5/8IN NON SAFETY (NEEDLE) ×6 IMPLANT
NEEDLE HYPO 30X.5 LL (NEEDLE) ×4 IMPLANT
NEEDLE PRECISIONGLIDE 27X1.5 (NEEDLE) IMPLANT
NS IRRIG 1000ML POUR BTL (IV SOLUTION) ×2 IMPLANT
PACK VITRECTOMY CUSTOM (CUSTOM PROCEDURE TRAY) ×2 IMPLANT
PAD ARMBOARD 7.5X6 YLW CONV (MISCELLANEOUS) ×4 IMPLANT
PAK PIK VITRECTOMY CVS 25GA (OPHTHALMIC) ×2 IMPLANT
PENCIL BIPOLAR 25GA STR DISP (OPHTHALMIC RELATED) ×2 IMPLANT
PICK MICROPICK 25G (MISCELLANEOUS) IMPLANT
PROBE DIATHERMY DSP 27GA (MISCELLANEOUS) IMPLANT
PROBE ENDO DIATHERMY 25G (MISCELLANEOUS) IMPLANT
PROBE LASER ILLUM FLEX CVD 25G (OPHTHALMIC) ×1 IMPLANT
REPL STRA BRUSH NDL (NEEDLE) IMPLANT
REPL STRA BRUSH NEEDLE (NEEDLE) IMPLANT
RESERVOIR BACK FLUSH (MISCELLANEOUS) IMPLANT
RETRACTOR IRIS FLEX 25G GRIESH (INSTRUMENTS) IMPLANT
SET INJECTOR OIL FLUID CONSTEL (OPHTHALMIC) IMPLANT
SPONGE SURGIFOAM ABS GEL 12-7 (HEMOSTASIS) IMPLANT
STOPCOCK 4 WAY LG BORE MALE ST (IV SETS) IMPLANT
SUT VICRYL 7 0 TG140 8 (SUTURE) ×2 IMPLANT
SYR 10ML LL (SYRINGE) ×2 IMPLANT
SYR 20ML LL LF (SYRINGE) ×2 IMPLANT
SYR 5ML LL (SYRINGE) ×2 IMPLANT
SYR BULB EAR ULCER 3OZ GRN STR (SYRINGE) ×2 IMPLANT
SYR TB 1ML LUER SLIP (SYRINGE) ×4 IMPLANT
TOWEL GREEN STERILE FF (TOWEL DISPOSABLE) ×2 IMPLANT
TUBING HIGH PRESS EXTEN 6IN (TUBING) ×2 IMPLANT
WATER STERILE IRR 1000ML POUR (IV SOLUTION) ×2 IMPLANT

## 2020-03-28 NOTE — Transfer of Care (Signed)
Immediate Anesthesia Transfer of Care Note  Patient: Elberta Lachapelle Eimer  Procedure(s) Performed: PARS PLANA VITRECTOMY WITH 25 GAUGE (Right Eye) MEMBRANE PEEL (Right Eye) GAS/FLUID EXCHANGE (Right Eye) INSERTION OF GAS (Right Eye)  Patient Location: PACU  Anesthesia Type:General  Level of Consciousness: drowsy, patient cooperative and responds to stimulation  Airway & Oxygen Therapy: Patient Spontanous Breathing and Patient connected to nasal cannula oxygen  Post-op Assessment: Report given to RN, Post -op Vital signs reviewed and stable and Patient moving all extremities X 4  Post vital signs: Reviewed and stable  Last Vitals:  Vitals Value Taken Time  BP 164/81 03/28/20 1441  Temp    Pulse 72 03/28/20 1442  Resp 10 03/28/20 1442  SpO2 100 % 03/28/20 1442  Vitals shown include unvalidated device data.  Last Pain:  Vitals:   03/28/20 0937  PainSc: 0-No pain         Complications: No complications documented.

## 2020-03-28 NOTE — Anesthesia Procedure Notes (Signed)
Procedure Name: Intubation Date/Time: 03/28/2020 12:44 PM Performed by: Shary Decamp, CRNA Pre-anesthesia Checklist: Patient identified, Patient being monitored, Timeout performed, Emergency Drugs available and Suction available Patient Re-evaluated:Patient Re-evaluated prior to induction Oxygen Delivery Method: Circle System Utilized Preoxygenation: Pre-oxygenation with 100% oxygen Induction Type: IV induction Ventilation: Mask ventilation without difficulty Laryngoscope Size: Miller and 2 Grade View: Grade I Tube type: Oral Tube size: 7.5 mm Number of attempts: 1 Airway Equipment and Method: Stylet Placement Confirmation: ETT inserted through vocal cords under direct vision,  positive ETCO2 and breath sounds checked- equal and bilateral Secured at: 21 cm Tube secured with: Tape Dental Injury: Teeth and Oropharynx as per pre-operative assessment

## 2020-03-28 NOTE — Op Note (Signed)
Date of procedure:  11.18.2021   Surgeon: Bernarda Caffey, MD, PhD  Assistant: Ernest Mallick, Ophthalmic Assistant   Pre-operative Diagnoses:  Epiretinal membrane, right eye   Post-operative diagnosis:  same   Anesthesia: General   Procedure:   1. 25 gauge pars plana vitrectomy, Right Eye 2. TissueBlue stain, Right Eye 3. Epiretinal Membrane and Internal Limiting Membrane peel, Right Eye  4. Injection 20% SF6, Right Eye   Indications for procedure: The patient presented with decreased vision and distortion due to ERM in the right eye. After discussing the risks, benefits, and alternatives to surgery, the patient electively decided to undergo surgical repair and informed consent was obtained. The surgery was an attempt to remove the epiretinal membrane and potentially improve the vision within the reasonable expectations of the surgeon.    Procedure in Detail:    The patient was met in the pre-operative holding area where their identification data was verified.  It was noted that there was a signed, informed consent in the chart and the Right Eye eye was verbally verified by the patient as the operative eye and was marked with a marking pen. The patient was then taken to the operating room and placed in the supine position. General endotracheal anesthesia was induced.  The eye was then prepped with 5% betadine and draped in the normal fashion for ophthalmic surgery. The microscope was draped and swung into position, and a secondary time-out was performed to identify the correct patient, eyes, procedures, and any allergies.   A 25 gauge trocar was inserted in a 30-45 degrees fashion into the inferotemporal quadrant 4 mm posterior to the limbus in this phakic patient.  Correct positioning within the vitreous was verified externally with the light pipe. The infusion was then connected to the cannula and BSS infusion was commenced.  Additional ports were placed in the superonasal and  superotemporal quadrants. Viscoat was placed on the cornea and the BIOM was used to visualize the posterior segment. A core vitrectomy was performed using the light pipe and vitrectomy probe.  The patient had a significant ERM with macular pucker and retinal striae in the central macula. A posterior vitreous detachment was confirmed using kenalog.    TissueBlue was then used to stain the internal limiting membrane and counter-stain the ERM. A macular contact lens was placed on the eye. End-grasping ILM forceps were used to create an opening in the ILM and ILMand overlying ERM were meticulously peeled off the entire surface of themacula en blocas was deemed safe. Of note, portions of the ILM were very adherent to the retina, but a very wide peel was achieved without complication.  The wide angle viewing system was brought back into positionto complete trimming of the peripheral vitreous. Scleral depression was used to inspect the peripheral retina for retinal tears/breaks.No retinal tears were noted. The endolaser was used to apply prophylactic peripheral laser retinopexy just posterior to the ora, 360 degrees. A completeair-fluid exchange was then performed.   The superotemporal port was then removed and sutured with 7-0 vicryl, there was no leakage. 20% SF6 gas was connected to the infusion line and gas was injected into the posterior segment while venting air through the superonasal trocar using the extrusion cannula. Once a full, 40cc of gas was vented through the eye, the infusion port and venting ports were removed and they were sutured with 7-0 vicryl.  There was no leakage from the sclerotomy sites.   Subconjunctival injections of kefzol + bacitracin + polymixin b  and kenalog were then administered, and antibiotic and steroid drops as well as antibiotic ointment were placed in the eye.  The drapes were removed and the eye was patched and shielded.  A green gas bracelet was placed on the  patient's wrist. The patient was then taken to the post-operative area for recovery having tolerated the procedure well.  She was instructed to perform face down positioning postoperatively and to follow up in clinic the following morning as scheduled.   Estimate blood lost: none Complications: None

## 2020-03-28 NOTE — Anesthesia Preprocedure Evaluation (Addendum)
Anesthesia Evaluation  Patient identified by MRN, date of birth, ID band Patient awake    Reviewed: Allergy & Precautions, H&P , NPO status , Patient's Chart, lab work & pertinent test results  Airway Mallampati: II  TM Distance: >3 FB Neck ROM: Full    Dental no notable dental hx. (+) Teeth Intact, Dental Advisory Given   Pulmonary sleep apnea and Continuous Positive Airway Pressure Ventilation , former smoker,    Pulmonary exam normal breath sounds clear to auscultation       Cardiovascular hypertension, Pt. on medications  Rhythm:Regular Rate:Normal     Neuro/Psych negative neurological ROS  negative psych ROS   GI/Hepatic Neg liver ROS, GERD  ,  Endo/Other  negative endocrine ROS  Renal/GU negative Renal ROS  negative genitourinary   Musculoskeletal   Abdominal   Peds  Hematology negative hematology ROS (+)   Anesthesia Other Findings   Reproductive/Obstetrics negative OB ROS                            Anesthesia Physical Anesthesia Plan  ASA: III  Anesthesia Plan: General   Post-op Pain Management:    Induction: Intravenous  PONV Risk Score and Plan: 4 or greater and Ondansetron, Dexamethasone and Midazolam  Airway Management Planned: Oral ETT  Additional Equipment:   Intra-op Plan:   Post-operative Plan: Extubation in OR  Informed Consent: I have reviewed the patients History and Physical, chart, labs and discussed the procedure including the risks, benefits and alternatives for the proposed anesthesia with the patient or authorized representative who has indicated his/her understanding and acceptance.     Dental advisory given  Plan Discussed with: CRNA  Anesthesia Plan Comments:         Anesthesia Quick Evaluation

## 2020-03-28 NOTE — Interval H&P Note (Signed)
History and Physical Interval Note:  03/28/2020 9:54 AM  Teresa Fernandez  has presented today for surgery, with the diagnosis of epiretinal membrane, right eye.  The various methods of treatment have been discussed with the patient and family. After consideration of risks, benefits and other options for treatment, the patient has consented to  Procedure(s): PARS PLANA VITRECTOMY WITH 25 GAUGE (Right) MEMBRANE PEEL (Right) as a surgical intervention.  The patient's history has been reviewed, patient examined, no change in status, stable for surgery.  I have reviewed the patient's chart and labs.  Questions were answered to the patient's satisfaction.     Rennis Chris

## 2020-03-28 NOTE — Anesthesia Postprocedure Evaluation (Signed)
Anesthesia Post Note  Patient: Nur Rabold Chlebowski  Procedure(s) Performed: PARS PLANA VITRECTOMY WITH 25 GAUGE (Right Eye) MEMBRANE PEEL (Right Eye) GAS/FLUID EXCHANGE (Right Eye) INSERTION OF GAS (Right Eye)     Patient location during evaluation: PACU Anesthesia Type: General Level of consciousness: awake and alert Pain management: pain level controlled Vital Signs Assessment: post-procedure vital signs reviewed and stable Respiratory status: spontaneous breathing, nonlabored ventilation and respiratory function stable Cardiovascular status: blood pressure returned to baseline and stable Postop Assessment: no apparent nausea or vomiting Anesthetic complications: no   No complications documented.  Last Vitals:  Vitals:   03/28/20 1530 03/28/20 1538  BP: (!) 151/70 (!) 152/78  Pulse: 61 (!) 57  Resp: 16 15  Temp:    SpO2: 95% 95%    Last Pain:  Vitals:   03/28/20 1530  PainSc: 0-No pain                 Zonnie Landen,W. EDMOND

## 2020-03-28 NOTE — Brief Op Note (Signed)
03/28/2020  2:31 PM  PATIENT:  Teresa Fernandez  64 y.o. female  PRE-OPERATIVE DIAGNOSIS:  epiretinal membrane, right eye  POST-OPERATIVE DIAGNOSIS:  epiretinal membrane, right eye  PROCEDURE:  Procedure(s): PARS PLANA VITRECTOMY WITH 25 GAUGE (Right) MEMBRANE PEEL (Right) GAS/FLUID EXCHANGE (Right) INSERTION OF GAS (Right)  SURGEON:  Surgeon(s) and Role:    Rennis Chris, MD - Primary  ASSISTANTS: Laurian Brim, Ophthalmic Assistant    ANESTHESIA:   general  EBL:  0 mL   BLOOD ADMINISTERED:none  DRAINS: none   LOCAL MEDICATIONS USED:  NONE  SPECIMEN:  No Specimen  DISPOSITION OF SPECIMEN:  N/A  COUNTS:  YES  TOURNIQUET:  * No tourniquets in log *  DICTATION: .Note written in EPIC  PLAN OF CARE: Discharge to home after PACU  PATIENT DISPOSITION:  PACU - hemodynamically stable.   Delay start of Pharmacological VTE agent (>24hrs) due to surgical blood loss or risk of bleeding: not applicable

## 2020-03-29 ENCOUNTER — Encounter (HOSPITAL_COMMUNITY): Payer: Self-pay | Admitting: Ophthalmology

## 2020-03-29 ENCOUNTER — Ambulatory Visit (INDEPENDENT_AMBULATORY_CARE_PROVIDER_SITE_OTHER): Payer: Federal, State, Local not specified - PPO | Admitting: Ophthalmology

## 2020-03-29 DIAGNOSIS — H35373 Puckering of macula, bilateral: Secondary | ICD-10-CM

## 2020-03-29 DIAGNOSIS — I10 Essential (primary) hypertension: Secondary | ICD-10-CM

## 2020-03-29 DIAGNOSIS — H3581 Retinal edema: Secondary | ICD-10-CM

## 2020-03-29 DIAGNOSIS — H35033 Hypertensive retinopathy, bilateral: Secondary | ICD-10-CM

## 2020-03-29 DIAGNOSIS — H25813 Combined forms of age-related cataract, bilateral: Secondary | ICD-10-CM

## 2020-04-03 ENCOUNTER — Encounter (INDEPENDENT_AMBULATORY_CARE_PROVIDER_SITE_OTHER): Payer: Self-pay | Admitting: Ophthalmology

## 2020-04-03 ENCOUNTER — Ambulatory Visit (INDEPENDENT_AMBULATORY_CARE_PROVIDER_SITE_OTHER): Payer: Self-pay | Admitting: Ophthalmology

## 2020-04-03 ENCOUNTER — Other Ambulatory Visit: Payer: Self-pay

## 2020-04-03 DIAGNOSIS — H25813 Combined forms of age-related cataract, bilateral: Secondary | ICD-10-CM

## 2020-04-03 DIAGNOSIS — I10 Essential (primary) hypertension: Secondary | ICD-10-CM

## 2020-04-03 DIAGNOSIS — H3581 Retinal edema: Secondary | ICD-10-CM

## 2020-04-03 DIAGNOSIS — H35373 Puckering of macula, bilateral: Secondary | ICD-10-CM

## 2020-04-03 DIAGNOSIS — H35033 Hypertensive retinopathy, bilateral: Secondary | ICD-10-CM

## 2020-04-03 MED ORDER — BACITRACIN-POLYMYXIN B 500-10000 UNIT/GM OP OINT: TOPICAL_OINTMENT | Freq: Every evening | OPHTHALMIC | 2 refills | Status: AC

## 2020-04-03 MED ORDER — PREDNISOLONE ACETATE 1 % OP SUSP: 1.0000 [drp] | Freq: Every day | OPHTHALMIC | 0 refills | Status: AC

## 2020-04-03 NOTE — Progress Notes (Signed)
[facility] - Clinic Note  04/03/2020     CHIEF COMPLAINT Patient presents for Retina Follow Up   HISTORY OF PRESENT ILLNESS: Teresa Fernandez is a 64 y.o. female who presents to the clinic today for:   HPI    Retina Follow Up    Patient presents with  Other.  In right eye.  This started 1 week ago.  I, the attending physician,  performed the HPI with the patient and updated documentation appropriately.          Comments    Patient here for 1 weeks retina follow up for s/p PPV + MP OD 03-28-20. Patient states vision doing better. No eye pain just a little sore. Sees the bubble just a peek over top. When looks down can see through it. Can see hand.       Last edited by Rennis Chris, MD on 04/03/2020  8:50 AM. (History)    pt states she is doing face down time "almost all the time", she states her neck is starting to hurt from being face down all the time, she is having a hard time with drops  Referring physician: Fredrich Birks, OD [address] [place] [address]  HISTORICAL INFORMATION:   Selected notes from the MEDICAL RECORD NUMBER Referred by Dr. Fredrich Birks LEE:  Ocular Hx- PMH-    CURRENT MEDICATIONS: Current Outpatient Medications (Ophthalmic Drugs)  Medication Sig   bacitracin-polymyxin b (POLYSPORIN) ophthalmic ointment Place into the right eye at bedtime. Place a 1/2 inch ribbon of ointment into the lower eyelid at bedtime and as needed   prednisoLONE acetate (PRED FORTE) 1 % ophthalmic suspension Place 1 drop into the right eye 6 (six) times daily.   No current facility-administered medications for this visit. (Ophthalmic Drugs)   Current Outpatient Medications (Other)  Medication Sig   acetaminophen (TYLENOL) 325 MG tablet Take 325-650 mg by mouth every 6 (six) hours as needed for moderate pain or headache.   atorvastatin (LIPITOR) 20 MG tablet Take 20 mg by mouth daily.   Calcium Carbonate-Vit D-Min (CALCIUM 1200  PO) Take 1 tablet by mouth daily.   cetirizine (ZYRTEC) 10 MG tablet Take 10 mg by mouth daily as needed for allergies.   ECHINACEA PO Take 2 tablets by mouth daily as needed (immune support).   fluocinonide ointment (LIDEX) 0.05 % Apply 1 application topically daily as needed (eczema).    fluticasone (FLONASE) 50 MCG/ACT nasal spray Place 1 spray into both nostrils daily as needed for allergies or rhinitis.   hydrochlorothiazide (HYDRODIURIL) 25 MG tablet Take 25 mg by mouth daily.    MAGNESIUM PO Take 1 tablet by mouth daily. With ashwagandha   Multiple Vitamin (MULTIVITAMIN WITH MINERALS) TABS tablet Take 1 tablet by mouth daily.   Potassium 99 MG TABS Take 99 mg by mouth every other day.   No current facility-administered medications for this visit. (Other)      REVIEW OF SYSTEMS: ROS    Positive for: Gastrointestinal, Cardiovascular, Eyes, Respiratory   Negative for: Constitutional, Neurological, Skin, Genitourinary, Musculoskeletal, HENT, Endocrine, Psychiatric, Allergic/Imm, Heme/Lymph   Last edited by Laddie Aquas, COA on 04/03/2020  8:41 AM. (History)       ALLERGIES No Known Allergies  PAST MEDICAL HISTORY Past Medical History:  Diagnosis Date   Cataract    Mixed form OU   GERD (gastroesophageal reflux disease)    occasional - diet controlled, no meds   Heart murmur  heard by PCP per pt on 03/27/20, PCP never referred pt to see cardiologist   HLD (hyperlipidemia)    Hypertension    Hypertensive retinopathy    OU   Seasonal allergies    Sleep apnea    uses cpap nightly   Past Surgical History:  Procedure Laterality Date   COLONOSCOPY     EYE SURGERY Right 03/28/2020   PPV+MP - Dr. Rennis ChrisBrian Lavonn Maxcy   GAS INSERTION Right 03/28/2020   Procedure: INSERTION OF GAS;  Surgeon: Rennis ChrisZamora, Marquita Lias, MD;  [facility];  Service: Ophthalmology;  Laterality: Right;   GAS/FLUID EXCHANGE Right 03/28/2020   Procedure: GAS/FLUID EXCHANGE;  Surgeon:  Rennis ChrisZamora, Chakara Bognar, MD;  [facility];  Service: Ophthalmology;  Laterality: Right;   MEMBRANE PEEL Right 03/28/2020   Procedure: MEMBRANE PEEL;  Surgeon: Rennis ChrisZamora, Leena Tiede, MD;  [facility];  Service: Ophthalmology;  Laterality: Right;   PARS PLANA VITRECTOMY Right 03/28/2020   Procedure: PARS PLANA VITRECTOMY WITH 25 GAUGE;  Surgeon: Rennis ChrisZamora, Cire Clute, MD;  [facility];  Service: Ophthalmology;  Laterality: Right;   SHOULDER SURGERY Right    remove bone spur   WISDOM TOOTH EXTRACTION      FAMILY HISTORY Family History  Problem Relation Age of Onset   Breast cancer Neg Hx     SOCIAL HISTORY Social History   Tobacco Use   Smoking status: Former Smoker    Years: 5.00    Types: Cigarettes   Smokeless tobacco: Never Used   Tobacco comment: smoked from age [age] yrs old   Vaping Use   Vaping Use: Never used  Substance Use Topics   Alcohol use: Never   Drug use: Never         OPHTHALMIC EXAM:  Base Eye Exam    Visual Acuity (Snellen - Linear)      Right Left   Dist cc CF at 2' 20/40   Dist ph cc  20/30       Tonometry (Tonopen, 8:37 AM)      Right Left   Pressure 12        Pupils      Dark Light Shape React APD   Right 5 5 Round dilated    Left 3 2 Round Brisk None       Visual Fields      Left Right     Full   Restrictions Total inferior temporal, inferior nasal deficiencies; Partial inner superior temporal, superior nasal deficiencies        Extraocular Movement      Right Left    Full Full       Neuro/Psych    Oriented x3: Yes   Mood/Affect: Normal       Dilation    Right eye: 1.0% Mydriacyl, 2.5% Phenylephrine @ 8:37 AM        Slit Lamp and Fundus Exam    Slit Lamp Exam      Right Left   Lids/Lashes Normal Normal   Conjunctiva/Sclera Subconjunctival hemorrhage--improving, sutures intact Mild Melanosis   Cornea Trace Punctate epithelial erosions, mild arcus, focal endotheilial deposits Trace Punctate epithelial erosions, arcus    Anterior Chamber Deep, 2+cell Deep and quiet   Iris Round and dilated Round and dilated   Lens 2+ Nuclear sclerosis, 2+ Cortical cataract, PC feathering improved 2+ Nuclear sclerosis, 2+ Cortical cataract   Vitreous post vitrectomy, 60% gas bubble  Vitreous syneresis       Fundus Exam      Right Left  Disc Pink and Sharp    C/D Ratio 0.4 0.4   Macula flat under gas, ERM gone    Vessels attenuated, Tortuous, mild Copper wiring    Periphery Attached, mild pigmented cystoid degeneration         Refraction    Wearing Rx      Sphere Cylinder Axis   Right -4.25 Sphere    Left -4.50 +0.50 175          IMAGING AND PROCEDURES  Imaging and Procedures for 04/03/2020           ASSESSMENT/PLAN:    ICD-10-CM   1. Epiretinal membrane (ERM) of both eyes  H35.373   2. Retinal edema  H35.81   3. Essential hypertension  I10   4. Hypertensive retinopathy of both eyes  H35.033   5. Combined forms of age-related cataract of both eyes  H25.813     1,2. Epiretinal membrane, OU (OD>OS)  - initial / pre-op BCVA OD: 20/40; OS: 20/20 (MRx) - pt reports generalized blur, mild metamorphopsia OD - OS with focal noncentral ERM w/ pucker, sup nasal macula  - POW1 s/p PPV/TissueBlue/MP/20% SF6 OD, 11.18.21             - doing well              - ERM gone   - gas bubble 60%             - IOP 12             - cont   PF 4x/day OD -- increased to 6x/day                         zymaxid QID OD -- stop Saturday, 11.27.21   Brimonidine QDaily -- okay to stop                         Atropine BID OD -- stop Saturday 11.27.21                         PSO ung QID OD -- QHS/PRN             - cont face down positioning 50% of time; avoid laying flat on back              - eye shield when sleeping x1 more week             - post op drop and positioning instructions reviewed  - tylenol/ibuprofen for pain - f/u 3 weeks -- POV, DFE, OCT  3,4. Hypertensive retinopathy OU - discussed importance of tight  BP control - monitor  5. Mixed Cataract OU - The symptoms of cataract, surgical options, and treatments and risks were discussed with patient. - discussed diagnosis and progression, specifically lkely progression of cataract OD post vitrectomy w/ gas - monitor   Ophthalmic Meds Ordered this visit:  Meds ordered this encounter  Medications   prednisoLONE acetate (PRED FORTE) 1 % ophthalmic suspension    Sig: Place 1 drop into the right eye 6 (six) times daily.    Dispense:  15 mL    Refill:  0   bacitracin-polymyxin b (POLYSPORIN) ophthalmic ointment    Sig: Place into the right eye at bedtime. Place a 1/2 inch ribbon of ointment into the lower eyelid at bedtime and as needed    Dispense:  3.5 g  Refill:  2       Return in about 3 weeks (around 04/24/2020) for f/u ERM OD, DFE, OCT.  There are no Patient Instructions on file for this visit.   Explained the diagnoses, plan, and follow up with the patient and they expressed understanding.  Patient expressed understanding of the importance of proper follow up care.   This document serves as a record of services personally performed by Karie Chimera, MD, PhD. It was created on their behalf by Glee Arvin. Manson Passey, OA an ophthalmic technician. The creation of this record is the provider's dictation and/or activities during the visit.    Electronically signed by: Glee Arvin. Manson Passey, [place] 11.24.2021 9:06 AM  Karie Chimera, M.D., Ph.D. Diseases & Surgery of the Retina and Vitreous [facility]  I have reviewed the above documentation for accuracy and completeness, and I agree with the above. Karie Chimera, M.D., Ph.D. 04/03/20 9:06 AM   Abbreviations: M myopia (nearsighted); A astigmatism; H hyperopia (farsighted); P presbyopia; Mrx spectacle prescription;  CTL contact lenses; OD right eye; OS left eye; OU both eyes  XT exotropia; ET esotropia; PEK punctate epithelial keratitis; PEE punctate epithelial erosions;  DES dry eye syndrome; MGD meibomian gland dysfunction; ATs artificial tears; PFAT's preservative free artificial tears; NSC nuclear sclerotic cataract; PSC posterior subcapsular cataract; ERM epi-retinal membrane; PVD posterior vitreous detachment; RD retinal detachment; DM diabetes mellitus; DR diabetic retinopathy; NPDR non-proliferative diabetic retinopathy; PDR proliferative diabetic retinopathy; CSME clinically significant macular edema; DME diabetic macular edema; dbh dot blot hemorrhages; CWS cotton wool spot; POAG primary open angle glaucoma; C/D cup-to-disc ratio; HVF humphrey visual field; GVF goldmann visual field; OCT optical coherence tomography; IOP intraocular pressure; BRVO Branch retinal vein occlusion; CRVO central retinal vein occlusion; CRAO central retinal artery occlusion; BRAO branch retinal artery occlusion; RT retinal tear; SB scleral buckle; PPV pars plana vitrectomy; VH Vitreous hemorrhage; PRP panretinal laser photocoagulation; IVK intravitreal kenalog; VMT vitreomacular traction; MH Macular hole;  NVD neovascularization of the disc; NVE neovascularization elsewhere; AREDS age related eye disease study; ARMD age related macular degeneration; POAG primary open angle glaucoma; EBMD epithelial/anterior basement membrane dystrophy; ACIOL anterior chamber intraocular lens; IOL intraocular lens; PCIOL posterior chamber intraocular lens; Phaco/IOL phacoemulsification with intraocular lens placement; PRK photorefractive keratectomy; LASIK laser assisted in situ keratomileusis; HTN hypertension; DM diabetes mellitus; COPD chronic obstructive pulmonary disease

## 2020-04-18 NOTE — Progress Notes (Signed)
[facility] Note  04/23/2020     CHIEF COMPLAINT Patient presents for Retina Follow Up   HISTORY OF PRESENT ILLNESS: Teresa Fernandez is a 64 y.o. female who presents to the clinic today for:   HPI    Retina Follow Up    Patient presents with  Other.  In right eye.  This started 3 weeks ago.  I, the attending physician,  performed the HPI with the patient and updated documentation appropriately.          Comments    Patient here for 3 weeks retina follow up for ERM OD. Patient states vision is improving everyday. No eye pain.       Last edited by Bernarda Caffey, MD on 04/23/2020  8:18 AM. (History)    pt states gas bubble has been gone for several days, she feels like her vision gets "better and better every day", she is using PF 6x/day  Referring physician: Macarthur Critchley, [place]. [place] [address]  HISTORICAL INFORMATION:   Selected notes from the MEDICAL RECORD NUMBER Referred by Dr. Macarthur Critchley   CURRENT MEDICATIONS: Current Outpatient Medications (Ophthalmic Drugs)  Medication Sig  . bacitracin-polymyxin b (POLYSPORIN) ophthalmic ointment Place into the right eye at bedtime. Place a 1/2 inch ribbon of ointment into the lower eyelid at bedtime and as needed  . prednisoLONE acetate (PRED FORTE) 1 % ophthalmic suspension Place 1 drop into the right eye 6 (six) times daily.   No current facility-administered medications for this visit. (Ophthalmic Drugs)   Current Outpatient Medications (Other)  Medication Sig  . acetaminophen (TYLENOL) 325 MG tablet Take 325-650 mg by mouth every 6 (six) hours as needed for moderate pain or headache.  Marland Kitchen atorvastatin (LIPITOR) 20 MG tablet Take 20 mg by mouth daily.  . Calcium Carbonate-Vit D-Min (CALCIUM 1200 PO) Take 1 tablet by mouth daily.  . cetirizine (ZYRTEC) 10 MG tablet Take 10 mg by mouth daily as needed for allergies.  Marland Kitchen ECHINACEA PO Take 2 tablets by mouth daily as  needed (immune support).  . fluocinonide ointment (LIDEX) 5.63 % Apply 1 application topically daily as needed (eczema).   . fluticasone (FLONASE) 50 MCG/ACT nasal spray Place 1 spray into both nostrils daily as needed for allergies or rhinitis.  . hydrochlorothiazide (HYDRODIURIL) 25 MG tablet Take 25 mg by mouth daily.   Marland Kitchen MAGNESIUM PO Take 1 tablet by mouth daily. With ashwagandha  . Multiple Vitamin (MULTIVITAMIN WITH MINERALS) TABS tablet Take 1 tablet by mouth daily.  . Potassium 99 MG TABS Take 99 mg by mouth every other day.   No current facility-administered medications for this visit. (Other)      REVIEW OF SYSTEMS: ROS    Positive for: Gastrointestinal, Cardiovascular, Eyes, Respiratory   Negative for: Constitutional, Neurological, Skin, Genitourinary, Musculoskeletal, HENT, Endocrine, Psychiatric, Allergic/Imm, Heme/Lymph   Last edited by Theodore Demark, COA on 04/23/2020  8:02 AM. (History)       ALLERGIES No Known Allergies  PAST MEDICAL HISTORY Past Medical History:  Diagnosis Date  . Cataract    Mixed form OU  . GERD (gastroesophageal reflux disease)    occasional - diet controlled, no meds  . Heart murmur    heard by PCP per pt on 03/27/20, PCP never referred pt to see cardiologist  . HLD (hyperlipidemia)   . Hypertension   . Hypertensive retinopathy    OU  . Seasonal allergies   .  Sleep apnea    uses cpap nightly   Past Surgical History:  Procedure Laterality Date  . COLONOSCOPY    . EYE SURGERY Right 03/28/2020   PPV+MP - Dr. Bernarda Caffey  . GAS INSERTION Right 03/28/2020   Procedure: INSERTION OF GAS;  Surgeon: Bernarda Caffey, MD;  Location: [facility];  Service: Ophthalmology;  Laterality: Right;  . GAS/FLUID EXCHANGE Right 03/28/2020   Procedure: GAS/FLUID EXCHANGE;  Surgeon: Bernarda Caffey, MD;  Location: [facility];  Service: Ophthalmology;  Laterality: Right;  . MEMBRANE PEEL Right 03/28/2020   Procedure: MEMBRANE PEEL;  Surgeon: Bernarda Caffey, MD;   Location: [facility];  Service: Ophthalmology;  Laterality: Right;  . PARS PLANA VITRECTOMY Right 03/28/2020   Procedure: PARS PLANA VITRECTOMY WITH 25 GAUGE;  Surgeon: Bernarda Caffey, MD;  Location: [facility];  Service: Ophthalmology;  Laterality: Right;  . SHOULDER SURGERY Right    remove bone spur  . WISDOM TOOTH EXTRACTION      FAMILY HISTORY Family History  Problem Relation Age of Onset  . Breast cancer Neg Hx     SOCIAL HISTORY Social History   Tobacco Use  . Smoking status: Former Smoker    Years: 5.00    Types: Cigarettes  . Smokeless tobacco: Never Used  . Tobacco comment: smoked from age [age] yrs old   Vaping Use  . Vaping Use: Never used  Substance Use Topics  . Alcohol use: Never  . Drug use: Never         OPHTHALMIC EXAM:  Base Eye Exam    Visual Acuity (Snellen - Linear)      Right Left   Dist cc 20/40 20/20 -2   Dist ph cc 20/30 -2    Correction: Glasses       Tonometry (Tonopen, 7:57 AM)      Right Left   Pressure 18 16       Pupils      Dark Light Shape React APD   Right 5 5 Round dilated None   Left 3 2 Round Brisk None       Visual Fields (Counting fingers)      Left Right    Full Full       Extraocular Movement      Right Left    Full Full       Neuro/Psych    Oriented x3: Yes   Mood/Affect: Normal       Dilation    Both eyes: 1.0% Mydriacyl, 2.5% Phenylephrine @ 7:57 AM        Slit Lamp and Fundus Exam    Slit Lamp Exam      Right Left   Lids/Lashes Normal Normal   Conjunctiva/Sclera sutures dissolving Mild Melanosis   Cornea Trace Punctate epithelial erosions, mild arcus, focal endotheilial pigment deposits Trace Punctate epithelial erosions, arcus   Anterior Chamber Deep, 0.5+pigment Deep and quiet   Iris Round and dilated Round and dilated   Lens 2-3+ Nuclear sclerosis, 2-3+ Cortical cataract 2+ Nuclear sclerosis, 2+ Cortical cataract   Vitreous post vitrectomy, gas bubble gone   Vitreous syneresis       Fundus Exam       Right Left   Disc Pink and Sharp Pink and Sharp   C/D Ratio 0.4 0.4   Macula Flat, Good foveal reflex, ERM and pucker gone good foveal reflex, focal ERM with striae SN mac   Vessels attenuated, Tortuous, mild Copper wiring, mild AV crossing changes attenuated, Tortuous, mild Copper wiring,  mild AV crossing changes   Periphery Attached, mild pigmented cystoid degeneration Attached, mild pigmented cystoid degeneration        Refraction    Wearing Rx      Sphere Cylinder Axis   Right -4.25 Sphere    Left -4.50 +0.50 175          IMAGING AND PROCEDURES  Imaging and Procedures for 04/23/2020  OCT, Retina - OU - Both Eyes       Right Eye Quality was good. Central Foveal Thickness: 373. Progression has improved. Findings include no IRF, no SRF, normal foveal contour (ERM gone, pucker improved).   Left Eye Quality was good. Central Foveal Thickness: 242. Progression has been stable. Findings include no IRF, no SRF, normal foveal contour, vitreomacular adhesion , epiretinal membrane, macular pucker (Focal ERM with pucker greatest ST macula).   Notes *Images captured and stored on drive  Diagnosis / Impression:  OD: NFP; no IRF/SRF; ERM gone, pucker improved OS: Focal ERM with pucker greatest ST macula  Clinical management:  See below  Abbreviations: NFP - Normal foveal profile. CME - cystoid macular edema. PED - pigment epithelial detachment. IRF - intraretinal fluid. SRF - subretinal fluid. EZ - ellipsoid zone. ERM - epiretinal membrane. ORA - outer retinal atrophy. ORT - outer retinal tubulation. SRHM - subretinal hyper-reflective material. IRHM - intraretinal hyper-reflective material                 ASSESSMENT/PLAN:    ICD-10-CM   1. Epiretinal membrane (ERM) of both eyes  H35.373   2. Retinal edema  H35.81 OCT, Retina - OU - Both Eyes  3. Essential hypertension  I10   4. Hypertensive retinopathy of both eyes  H35.033   5. Combined forms of age-related  cataract of both eyes  H25.813     1,2. Epiretinal membrane, OU (OD>OS)  - pre-op BCVA OD: 20/50; OS: 20/20 (MRx) - pt reports generalized blur, mild metamorphopsia OD - OS with focal noncentral ERM w/ pucker, sup nasal macula  - POW4 s/p PPV/TissueBlue/MP/20% SF6 OD, 11.18.21             - doing well -- BCVA already 20/40             - ERM gone   - gas bubble gone             - IOP 18             - start PF taper -- 4,3,2,1 drops daily, decrease weekly  - PSO ung QHS/PRN OD -- okay to stop             - d/c positioning              - post op drop instructions reviewed  - f/u 6-8 weeks -- POV, DFE, OCT  3,4. Hypertensive retinopathy OU - discussed importance of tight BP control - monitor  5. Mixed Cataract OU - The symptoms of cataract, surgical options, and treatments and risks were discussed with patient. - discussed diagnosis and progression, specifically lkely progression of cataract OD post vitrectomy w/ gas - monitor   Ophthalmic Meds Ordered this visit:  No orders of the defined types were placed in this encounter.      Return for f/u 6-8 weeks, ERM OU, DFE, OCT.  There are no Patient Instructions on file for this visit.   Explained the diagnoses, plan, and follow up with the patient and they expressed understanding.  Patient expressed understanding  of the importance of proper follow up care.   This document serves as a record of services personally performed by Gardiner Sleeper, MD, PhD. It was created on their behalf by San Jetty. Owens Shark, OA an ophthalmic technician. The creation of this record is the provider's dictation and/or activities during the visit.    Electronically signed by: San Jetty. Owens Shark, [place] 12.14.2021 10:59 PM  Gardiner Sleeper, M.D., Ph.D. Diseases & Surgery of the Retina and [place] 04/23/2020   I have reviewed the above documentation for accuracy and completeness, and I agree with the above. Gardiner Sleeper,  M.D., Ph.D. 04/23/20 10:59 PM   Abbreviations: M myopia (nearsighted); A astigmatism; H hyperopia (farsighted); P presbyopia; Mrx spectacle prescription;  CTL contact lenses; OD right eye; OS left eye; OU both eyes  XT exotropia; ET esotropia; PEK punctate epithelial keratitis; PEE punctate epithelial erosions; DES dry eye syndrome; MGD meibomian gland dysfunction; ATs artificial tears; PFAT's preservative free artificial tears; [place] nuclear sclerotic cataract; PSC posterior subcapsular cataract; ERM epi-retinal membrane; PVD posterior vitreous detachment; RD retinal detachment; DM diabetes mellitus; DR diabetic retinopathy; NPDR non-proliferative diabetic retinopathy; PDR proliferative diabetic retinopathy; CSME clinically significant macular edema; DME diabetic macular edema; dbh dot blot hemorrhages; CWS cotton wool spot; POAG primary open angle glaucoma; C/D cup-to-disc ratio; HVF humphrey visual field; GVF goldmann visual field; OCT optical coherence tomography; IOP intraocular pressure; BRVO Branch retinal vein occlusion; CRVO central retinal vein occlusion; CRAO central retinal artery occlusion; BRAO branch retinal artery occlusion; RT retinal tear; SB scleral buckle; PPV pars plana vitrectomy; VH Vitreous hemorrhage; PRP panretinal laser photocoagulation; IVK intravitreal kenalog; VMT vitreomacular traction; MH Macular hole;  NVD neovascularization of the disc; NVE neovascularization elsewhere; AREDS age related eye disease study; ARMD age related macular degeneration; POAG primary open angle glaucoma; EBMD epithelial/anterior basement membrane dystrophy; ACIOL anterior chamber intraocular lens; IOL intraocular lens; PCIOL posterior chamber intraocular lens; Phaco/IOL phacoemulsification with intraocular lens placement; [place] photorefractive keratectomy; LASIK laser assisted in situ keratomileusis; HTN hypertension; DM diabetes mellitus; COPD chronic obstructive pulmonary disease

## 2020-04-23 ENCOUNTER — Ambulatory Visit (INDEPENDENT_AMBULATORY_CARE_PROVIDER_SITE_OTHER): Payer: Federal, State, Local not specified - PPO | Admitting: Ophthalmology

## 2020-04-23 ENCOUNTER — Other Ambulatory Visit: Payer: Self-pay

## 2020-04-23 ENCOUNTER — Encounter (INDEPENDENT_AMBULATORY_CARE_PROVIDER_SITE_OTHER): Payer: Self-pay | Admitting: Ophthalmology

## 2020-04-23 DIAGNOSIS — H3581 Retinal edema: Secondary | ICD-10-CM

## 2020-04-23 DIAGNOSIS — H35373 Puckering of macula, bilateral: Secondary | ICD-10-CM

## 2020-04-23 DIAGNOSIS — I10 Essential (primary) hypertension: Secondary | ICD-10-CM

## 2020-04-23 DIAGNOSIS — H25813 Combined forms of age-related cataract, bilateral: Secondary | ICD-10-CM

## 2020-04-23 DIAGNOSIS — H35033 Hypertensive retinopathy, bilateral: Secondary | ICD-10-CM

## 2020-04-26 ENCOUNTER — Ambulatory Visit: Payer: Federal, State, Local not specified - PPO | Attending: Internal Medicine

## 2020-04-26 DIAGNOSIS — Z23 Encounter for immunization: Secondary | ICD-10-CM

## 2020-04-26 NOTE — Progress Notes (Signed)
   Covid-19 Vaccination Clinic  Name:  Teresa Fernandez    MRN: [id number] DOB: 1955/11/28  04/26/2020  Teresa Fernandez was observed post Covid-19 immunization for 15 minutes without incident. She was provided with Vaccine Information Sheet and instruction to access the V-Safe system.   Teresa Fernandez was instructed to call 911 with any severe reactions post vaccine: Marland Kitchen Difficulty breathing  . Swelling of face and throat  . A fast heartbeat  . A bad rash all over body  . Dizziness and weakness   Immunizations Administered    Name Date Dose VIS Date Route   Pfizer COVID-19 Vaccine 04/26/2020  2:47 PM 0.3 mL 02/28/2020 Intramuscular   Manufacturer: [employer], [employer]   Lot: [id number]   NDC: [address]-0

## 2020-04-29 DIAGNOSIS — G4733 Obstructive sleep apnea (adult) (pediatric): Secondary | ICD-10-CM | POA: Diagnosis not present

## 2020-06-13 NOTE — Progress Notes (Signed)
[facility] Note  06/18/2020     CHIEF COMPLAINT Patient presents for Post-op Follow-up   HISTORY OF PRESENT ILLNESS: Teresa Fernandez is a 65 y.o. female who presents to the clinic today for:  HPI    Post-op Follow-up    In right eye.  Vision is stable.  I, the attending physician,  performed the HPI with the patient and updated documentation appropriately.          Comments    8 week follow up: ERM sx OD 03/28/2020- Doing well, pt thinks vision is better.         Last edited by Bernarda Caffey, MD on 06/18/2020  9:34 AM. (History)     Pt feels like her vision is doing "good", she tapered off the PF as directed  Referring physician: Macarthur Critchley, [address]. [place] [address]  HISTORICAL INFORMATION:   Selected notes from the MEDICAL RECORD NUMBER Referred by Dr. Macarthur Critchley   CURRENT MEDICATIONS: Current Outpatient Medications (Ophthalmic Drugs)  Medication Sig  . bacitracin-polymyxin b (POLYSPORIN) ophthalmic ointment Place into the right eye at bedtime. Place a 1/2 inch ribbon of ointment into the lower eyelid at bedtime and as needed (Patient not taking: Reported on 06/18/2020)  . prednisoLONE acetate (PRED FORTE) 1 % ophthalmic suspension Place 1 drop into the right eye 6 (six) times daily. (Patient not taking: Reported on 06/18/2020)   No current facility-administered medications for this visit. (Ophthalmic Drugs)   Current Outpatient Medications (Other)  Medication Sig  . acetaminophen (TYLENOL) 325 MG tablet Take 325-650 mg by mouth every 6 (six) hours as needed for moderate pain or headache.  Marland Kitchen atorvastatin (LIPITOR) 20 MG tablet Take 20 mg by mouth daily.  . Calcium Carbonate-Vit D-Min (CALCIUM 1200 PO) Take 1 tablet by mouth daily.  . cetirizine (ZYRTEC) 10 MG tablet Take 10 mg by mouth daily as needed for allergies.  Marland Kitchen ECHINACEA PO Take 2 tablets by mouth daily as needed (immune support).  . fluocinonide ointment  (LIDEX) 1.74 % Apply 1 application topically daily as needed (eczema).   . fluticasone (FLONASE) 50 MCG/ACT nasal spray Place 1 spray into both nostrils daily as needed for allergies or rhinitis.  . hydrochlorothiazide (HYDRODIURIL) 25 MG tablet Take 25 mg by mouth daily.   Marland Kitchen MAGNESIUM PO Take 1 tablet by mouth daily. With ashwagandha  . Multiple Vitamin (MULTIVITAMIN WITH MINERALS) TABS tablet Take 1 tablet by mouth daily.  . Potassium 99 MG TABS Take 99 mg by mouth every other day.   No current facility-administered medications for this visit. (Other)      REVIEW OF SYSTEMS: ROS    Positive for: Gastrointestinal, Cardiovascular, Eyes, Respiratory   Negative for: Constitutional, Neurological, Skin, Genitourinary, Musculoskeletal, HENT, Endocrine, Psychiatric, Allergic/Imm, Heme/Lymph   Last edited by Leonie Douglas, COA on 06/18/2020  8:42 AM. (History)       ALLERGIES No Known Allergies  PAST MEDICAL HISTORY Past Medical History:  Diagnosis Date  . Cataract    Mixed form OU  . GERD (gastroesophageal reflux disease)    occasional - diet controlled, no meds  . Heart murmur    heard by PCP per pt on 03/27/20, PCP never referred pt to see cardiologist  . HLD (hyperlipidemia)   . Hypertension   . Hypertensive retinopathy    OU  . Seasonal allergies   . Sleep apnea    uses cpap nightly   Past Surgical History:  Procedure Laterality Date  . COLONOSCOPY    . EYE SURGERY Right 03/28/2020   PPV+MP - Dr. Bernarda Caffey  . GAS INSERTION Right 03/28/2020   Procedure: INSERTION OF GAS;  Surgeon: Bernarda Caffey, MD;  Location: [facility];  Service: Ophthalmology;  Laterality: Right;  . GAS/FLUID EXCHANGE Right 03/28/2020   Procedure: GAS/FLUID EXCHANGE;  Surgeon: Bernarda Caffey, MD;  Location: [facility];  Service: Ophthalmology;  Laterality: Right;  . MEMBRANE PEEL Right 03/28/2020   Procedure: MEMBRANE PEEL;  Surgeon: Bernarda Caffey, MD;  Location: [facility];  Service: Ophthalmology;  Laterality:  Right;  . PARS PLANA VITRECTOMY Right 03/28/2020   Procedure: PARS PLANA VITRECTOMY WITH 25 GAUGE;  Surgeon: Bernarda Caffey, MD;  Location: [facility];  Service: Ophthalmology;  Laterality: Right;  . SHOULDER SURGERY Right    remove bone spur  . WISDOM TOOTH EXTRACTION      FAMILY HISTORY Family History  Problem Relation Age of Onset  . Breast cancer Neg Hx     SOCIAL HISTORY Social History   Tobacco Use  . Smoking status: Former Smoker    Years: 5.00    Types: Cigarettes  . Smokeless tobacco: Never Used  . Tobacco comment: smoked from age [age] yrs old   Vaping Use  . Vaping Use: Never used  Substance Use Topics  . Alcohol use: Never  . Drug use: Never         OPHTHALMIC EXAM:  Base Eye Exam    Visual Acuity (Snellen - Linear)      Right Left   Dist cc 20/40 +1 20/20   Dist ph cc 20/30 +1        Tonometry (Tonopen, 8:47 AM)      Right Left   Pressure 16 16       Pupils      Dark Light Shape React APD   Right 3 2 Round Brisk None   Left 3 2 Round Brisk None       Visual Fields (Counting fingers)      Left Right    Full Full       Extraocular Movement      Right Left    Full Full       Neuro/Psych    Oriented x3: Yes   Mood/Affect: Normal       Dilation    Both eyes: 1.0% Mydriacyl, 2.5% Phenylephrine @ 8:47 AM        Slit Lamp and Fundus Exam    Slit Lamp Exam      Right Left   Lids/Lashes Normal Normal   Conjunctiva/Sclera sutures dissolving Mild Melanosis   Cornea Trace Punctate epithelial erosions, mild arcus, focal endotheilial pigment deposits Trace Punctate epithelial erosions, arcus   Anterior Chamber Deep, 0.5+pigment Deep and quiet   Iris Round and dilated Round and dilated   Lens 2-3+ Nuclear sclerosis, 2-3+ Cortical cataract 2+ Nuclear sclerosis, 2+ Cortical cataract   Vitreous post vitrectomy, gas bubble gone   Vitreous syneresis       Fundus Exam      Right Left   Disc Pink and Sharp, mild PPA/PPP Pink and Sharp, mild early  fibrosis   C/D Ratio 0.4 0.4   Macula Flat, Good foveal reflex, ERM and pucker gone good foveal reflex, ERM with striae SN mac   Vessels mild attenuation, mild tortuousity attenuated, mild tortuousity, mild AV crossing changes, mild Copper wiring   Periphery Attached, mild pigmented cystoid degeneration Attached, mild pigmented cystoid degeneration  Refraction    Wearing Rx      Sphere Cylinder Axis   Right -4.25 Sphere    Left -4.50 +0.50 175          IMAGING AND PROCEDURES  Imaging and Procedures for 06/18/2020  OCT, Retina - OU - Both Eyes       Right Eye Quality was good. Central Foveal Thickness: 365. Progression has been stable. Findings include no IRF, no SRF, normal foveal contour (ERM gone, pucker improved, mild inner retinal surface irregularity).   Left Eye Quality was good. Central Foveal Thickness: 294. Progression has worsened. Findings include no IRF, no SRF, vitreomacular adhesion , epiretinal membrane, macular pucker, abnormal foveal contour (Mild progression of ERM).   Notes *Images captured and stored on drive  Diagnosis / Impression:  OD: NFP; no IRF/SRF; ERM gone, pucker improved OS: Mild progression of ERM w/ pucker  Clinical management:  See below  Abbreviations: NFP - Normal foveal profile. CME - cystoid macular edema. PED - pigment epithelial detachment. IRF - intraretinal fluid. SRF - subretinal fluid. EZ - ellipsoid zone. ERM - epiretinal membrane. ORA - outer retinal atrophy. ORT - outer retinal tubulation. SRHM - subretinal hyper-reflective material. IRHM - intraretinal hyper-reflective material                 ASSESSMENT/PLAN:    ICD-10-CM   1. Epiretinal membrane (ERM) of both eyes  H35.373   2. Retinal edema  H35.81 OCT, Retina - OU - Both Eyes  3. Essential hypertension  I10   4. Hypertensive retinopathy of both eyes  H35.033   5. Combined forms of age-related cataract of both eyes  H25.813     1,2. Epiretinal  membrane, OU (OD>OS)  - pre-op BCVA OD: 20/50; OS: 20/20 (MRx) - pt reports generalized blur, mild metamorphopsia OD - OS with focal noncentral ERM w/ pucker, sup nasal macula -- mild progression on OCT today but VA remains 20/20 OS  - POM3 s/p PPV/TissueBlue/MP/20% SF6 OD, 11.18.21             - doing well -- BCVA 20/30             - ERM gone              - IOP 16             - completed PF taper OD - f/u 4 months -- DFE, OCT  3,4. Hypertensive retinopathy OU - discussed importance of tight BP control - monitor  5. Mixed Cataract OU - The symptoms of cataract, surgical options, and treatments and risks were discussed with patient. - discussed diagnosis and progression, specifically lkely progression of cataract OD post vitrectomy w/ gas - monitor   Ophthalmic Meds Ordered this visit:  No orders of the defined types were placed in this encounter.      Return in about 4 months (around 10/16/2020) for f/u ERM OU, DFE, OCT.  There are no Patient Instructions on file for this visit.   Explained the diagnoses, plan, and follow up with the patient and they expressed understanding.  Patient expressed understanding of the importance of proper follow up care.   This document serves as a record of services personally performed by Gardiner Sleeper, MD, PhD. It was created on their behalf by Estill Bakes, COT an ophthalmic technician. The creation of this record is the provider's dictation and/or activities during the visit.    Electronically signed by: Estill Bakes, COT 2.3.22 @ 9:36  AM   This document serves as a record of services personally performed by Gardiner Sleeper, MD, PhD. It was created on their behalf by San Jetty. Owens Shark, OA an ophthalmic technician. The creation of this record is the provider's dictation and/or activities during the visit.    Electronically signed by: San Jetty. Owens Shark, [place] 02.08.2022 9:36 AM   Gardiner Sleeper, M.D., Ph.D. Diseases & Surgery of the Retina and  Vitreous [facility]  I have reviewed the above documentation for accuracy and completeness, and I agree with the above. Gardiner Sleeper, M.D., Ph.D. 06/18/20 9:36 AM   Abbreviations: M myopia (nearsighted); A astigmatism; H hyperopia (farsighted); P presbyopia; Mrx spectacle prescription;  CTL contact lenses; OD right eye; OS left eye; OU both eyes  XT exotropia; ET esotropia; PEK punctate epithelial keratitis; PEE punctate epithelial erosions; DES dry eye syndrome; MGD meibomian gland dysfunction; ATs artificial tears; PFAT's preservative free artificial tears; [place] nuclear sclerotic cataract; PSC posterior subcapsular cataract; ERM epi-retinal membrane; PVD posterior vitreous detachment; RD retinal detachment; DM diabetes mellitus; DR diabetic retinopathy; NPDR non-proliferative diabetic retinopathy; PDR proliferative diabetic retinopathy; CSME clinically significant macular edema; DME diabetic macular edema; dbh dot blot hemorrhages; CWS cotton wool spot; POAG primary open angle glaucoma; C/D cup-to-disc ratio; HVF humphrey visual field; GVF goldmann visual field; OCT optical coherence tomography; IOP intraocular pressure; BRVO Branch retinal vein occlusion; CRVO central retinal vein occlusion; CRAO central retinal artery occlusion; BRAO branch retinal artery occlusion; RT retinal tear; SB scleral buckle; PPV pars plana vitrectomy; VH Vitreous hemorrhage; PRP panretinal laser photocoagulation; IVK intravitreal kenalog; VMT vitreomacular traction; MH Macular hole;  NVD neovascularization of the disc; NVE neovascularization elsewhere; AREDS age related eye disease study; ARMD age related macular degeneration; POAG primary open angle glaucoma; EBMD epithelial/anterior basement membrane dystrophy; ACIOL anterior chamber intraocular lens; IOL intraocular lens; PCIOL posterior chamber intraocular lens; Phaco/IOL phacoemulsification with intraocular lens placement; [place] photorefractive  keratectomy; LASIK laser assisted in situ keratomileusis; HTN hypertension; DM diabetes mellitus; COPD chronic obstructive pulmonary disease

## 2020-06-18 ENCOUNTER — Other Ambulatory Visit: Payer: Self-pay

## 2020-06-18 ENCOUNTER — Encounter (INDEPENDENT_AMBULATORY_CARE_PROVIDER_SITE_OTHER): Payer: Self-pay | Admitting: Ophthalmology

## 2020-06-18 ENCOUNTER — Ambulatory Visit (INDEPENDENT_AMBULATORY_CARE_PROVIDER_SITE_OTHER): Payer: Federal, State, Local not specified - PPO | Admitting: Ophthalmology

## 2020-06-18 DIAGNOSIS — H35033 Hypertensive retinopathy, bilateral: Secondary | ICD-10-CM | POA: Diagnosis not present

## 2020-06-18 DIAGNOSIS — H3581 Retinal edema: Secondary | ICD-10-CM

## 2020-06-18 DIAGNOSIS — I10 Essential (primary) hypertension: Secondary | ICD-10-CM | POA: Diagnosis not present

## 2020-06-18 DIAGNOSIS — H25813 Combined forms of age-related cataract, bilateral: Secondary | ICD-10-CM

## 2020-06-18 DIAGNOSIS — H35373 Puckering of macula, bilateral: Secondary | ICD-10-CM

## 2020-06-19 DIAGNOSIS — I10 Essential (primary) hypertension: Secondary | ICD-10-CM | POA: Diagnosis not present

## 2020-06-19 DIAGNOSIS — H35033 Hypertensive retinopathy, bilateral: Secondary | ICD-10-CM | POA: Diagnosis not present

## 2020-06-19 DIAGNOSIS — E7800 Pure hypercholesterolemia, unspecified: Secondary | ICD-10-CM | POA: Diagnosis not present

## 2020-06-19 DIAGNOSIS — Z0000 Encounter for general adult medical examination without abnormal findings: Secondary | ICD-10-CM | POA: Diagnosis not present

## 2020-06-19 DIAGNOSIS — Z23 Encounter for immunization: Secondary | ICD-10-CM | POA: Diagnosis not present

## 2020-07-02 DIAGNOSIS — G4733 Obstructive sleep apnea (adult) (pediatric): Secondary | ICD-10-CM | POA: Diagnosis not present

## 2020-07-03 DIAGNOSIS — E119 Type 2 diabetes mellitus without complications: Secondary | ICD-10-CM | POA: Diagnosis not present

## 2020-07-03 DIAGNOSIS — I10 Essential (primary) hypertension: Secondary | ICD-10-CM | POA: Diagnosis not present

## 2020-07-29 DIAGNOSIS — G4733 Obstructive sleep apnea (adult) (pediatric): Secondary | ICD-10-CM | POA: Diagnosis not present

## 2020-09-02 ENCOUNTER — Ambulatory Visit: Payer: Federal, State, Local not specified - PPO | Admitting: Podiatry

## 2020-09-02 ENCOUNTER — Other Ambulatory Visit: Payer: Self-pay

## 2020-09-02 DIAGNOSIS — B351 Tinea unguium: Secondary | ICD-10-CM

## 2020-09-02 DIAGNOSIS — M79675 Pain in left toe(s): Secondary | ICD-10-CM | POA: Diagnosis not present

## 2020-09-02 DIAGNOSIS — M79674 Pain in right toe(s): Secondary | ICD-10-CM | POA: Diagnosis not present

## 2020-09-02 NOTE — Progress Notes (Signed)
   Subjective: 65 y.o. female presenting today for new complaint regarding thickening and discoloration to the bilateral great toenails.  Patient is concerned for possible fungus.  She would like to discuss different treatment options and be able to wear sandals this summer.  She presents for further treatment and evaluation  Past Medical History:  Diagnosis Date  . Cataract    Mixed form OU  . GERD (gastroesophageal reflux disease)    occasional - diet controlled, no meds  . Heart murmur    heard by PCP per pt on 03/27/20, PCP never referred pt to see cardiologist  . HLD (hyperlipidemia)   . Hypertension   . Hypertensive retinopathy    OU  . Seasonal allergies   . Sleep apnea    uses cpap nightly    Objective: Physical Exam General: The patient is alert and oriented x3 in no acute distress.  Dermatology: Hyperkeratotic, discolored, thickened, onychodystrophy noted to the bilateral great toes. Skin is warm, dry and supple bilateral lower extremities. Negative for open lesions or macerations.  Vascular: Palpable pedal pulses bilaterally. No edema or erythema noted. Capillary refill within normal limits.  Neurological: Epicritic and protective threshold grossly intact bilaterally.   Musculoskeletal Exam: Range of motion within normal limits to all pedal and ankle joints bilateral. Muscle strength 5/5 in all groups bilateral.   Assessment: #1 Onychomycosis of toenails bilateral great toes  Plan of Care:  #1 Patient was evaluated. #2 today we discussed different treatment options including oral, topical, and laser antifungal treatment modalities.  The patient would like to pursue laser antifungal treatments. #3 appointment with our nurse here in the office for laser treatment #4 patient was also inquiring about antifungal toenail polish.  Polish was provided today at checkout #5 return to clinic as needed   Felecia Shelling, DPM [facility]  Dr. Felecia Shelling,  DPM    [address]                                        [place], [place] [address]                Office [phone]  Fax [phone]

## 2020-09-09 ENCOUNTER — Ambulatory Visit (INDEPENDENT_AMBULATORY_CARE_PROVIDER_SITE_OTHER): Payer: Federal, State, Local not specified - PPO

## 2020-09-09 ENCOUNTER — Other Ambulatory Visit: Payer: Self-pay

## 2020-09-09 DIAGNOSIS — M79674 Pain in right toe(s): Secondary | ICD-10-CM

## 2020-09-09 DIAGNOSIS — B351 Tinea unguium: Secondary | ICD-10-CM

## 2020-09-09 DIAGNOSIS — M79675 Pain in left toe(s): Secondary | ICD-10-CM

## 2020-09-09 NOTE — Progress Notes (Signed)
Patient presents today for the 1st laser treatment. Diagnosed with mycotic nail infection by Dr. Logan Bores.   Toenail most affected bilateral 1st, 2nd and 5th.  All other systems are negative.  Nails were filed thin. Laser therapy was administered to bilateral 1st, 2nd and 5th toenails and patient tolerated the treatment well. All safety precautions were in place.    Follow up in 4 weeks for laser # 2.

## 2020-09-09 NOTE — Patient Instructions (Signed)

## 2020-10-11 ENCOUNTER — Ambulatory Visit (INDEPENDENT_AMBULATORY_CARE_PROVIDER_SITE_OTHER): Payer: Self-pay

## 2020-10-11 ENCOUNTER — Other Ambulatory Visit: Payer: Self-pay

## 2020-10-11 DIAGNOSIS — M79675 Pain in left toe(s): Secondary | ICD-10-CM

## 2020-10-11 DIAGNOSIS — M79674 Pain in right toe(s): Secondary | ICD-10-CM

## 2020-10-11 DIAGNOSIS — B351 Tinea unguium: Secondary | ICD-10-CM

## 2020-10-11 NOTE — Progress Notes (Signed)
Patient presents today for the 2nd laser treatment. Diagnosed with mycotic nail infection by Dr. Logan Bores.   Toenail most affected bilateral 1st, 2nd and 5th.  All other systems are negative.  Nails were filed thin. Laser therapy was administered to bilateral 1st, 2nd and 5th toenails and patient tolerated the treatment well. All safety precautions were in place.    Follow up in 4 weeks for laser # 3.

## 2020-10-16 NOTE — Progress Notes (Addendum)
[facility] Note  10/17/2020     CHIEF COMPLAINT Patient presents for Retina Follow Up   HISTORY OF PRESENT ILLNESS: Teresa Fernandez is a 65 y.o. female who presents to the clinic today for:  HPI     Retina Follow Up   Patient presents with  Other.  In both eyes.  Duration of 4 months.  Since onset it is stable.  I, the attending physician,  performed the HPI with the patient and updated documentation appropriately.        Comments   Pt here for 48moretinal f/u ERM OU. Pt states vision in OD has gotten worse due to cataract progression. She had her AEE in April, hasn't been set up for surgical consult. Dr. SNicki Reaperrecommended she see Dr. ZCoralyn Pearfirst before any surgery is scheduled. No ocular pain or discomfort noted.       Last edited by ZBernarda Caffey MD on 10/17/2020 11:41 PM.     Pt had an appt with Dr. SNicki Reaperwho said she is probably ready for cataract sx, but is going to let Dr. ZCoralyn Pearmake the final call, pt feels like her left eye vision is decreased  Referring physician: JMacarthur Critchley [id number] HISTORICAL INFORMATION:   Selected notes from the MEDICAL RECORD NUMBER Referred by Dr. JMacarthur Critchley  CURRENT MEDICATIONS: Current Outpatient Medications (Ophthalmic Drugs)  Medication Sig   bacitracin-polymyxin b (POLYSPORIN) ophthalmic ointment Place into the right eye at bedtime. Place a 1/2 inch ribbon of ointment into the lower eyelid at bedtime and as needed (Patient not taking: Reported on 06/18/2020)   prednisoLONE acetate (PRED FORTE) 1 % ophthalmic suspension Place 1 drop into the right eye 6 (six) times daily. (Patient not taking: Reported on 06/18/2020)   No current facility-administered medications for this visit. (Ophthalmic Drugs)   Current Outpatient Medications (Other)  Medication Sig   acetaminophen (TYLENOL) 325 MG tablet Take 325-650 mg by mouth every 6 (six) hours as needed for moderate  pain or headache.   atorvastatin (LIPITOR) 20 MG tablet Take 20 mg by mouth daily.   Calcium Carbonate-Vit D-Min (CALCIUM 1200 PO) Take 1 tablet by mouth daily.   cetirizine (ZYRTEC) 10 MG tablet Take 10 mg by mouth daily as needed for allergies.   ECHINACEA PO Take 2 tablets by mouth daily as needed (immune support).   fluocinonide ointment (LIDEX) 06.04% Apply 1 application topically daily as needed (eczema).    fluticasone (FLONASE) 50 MCG/ACT nasal spray Place 1 spray into both nostrils daily as needed for allergies or rhinitis.   hydrochlorothiazide (HYDRODIURIL) 25 MG tablet Take 25 mg by mouth daily.    MAGNESIUM PO Take 1 tablet by mouth daily. With ashwagandha   Multiple Vitamin (MULTIVITAMIN WITH MINERALS) TABS tablet Take 1 tablet by mouth daily.   Potassium 99 MG TABS Take 99 mg by mouth every other day.   No current facility-administered medications for this visit. (Other)      REVIEW OF SYSTEMS: ROS   Positive for: Gastrointestinal, Cardiovascular, Eyes, Respiratory Negative for: Constitutional, Neurological, Skin, Genitourinary, Musculoskeletal, HENT, Endocrine, Psychiatric, Allergic/Imm, Heme/Lymph Last edited by SKingsley Spittle COT on 10/17/2020  9:20 AM.       ALLERGIES No Known Allergies  PAST MEDICAL HISTORY Past Medical History:  Diagnosis Date   Cataract    Mixed form OU   GERD (gastroesophageal reflux disease)    occasional - diet  controlled, no meds   Heart murmur    heard by PCP per pt on 03/27/20, PCP never referred pt to see cardiologist   HLD (hyperlipidemia)    Hypertension    Hypertensive retinopathy    OU   Seasonal allergies    Sleep apnea    uses cpap nightly   Past Surgical History:  Procedure Laterality Date   COLONOSCOPY     EYE SURGERY Right 03/28/2020   PPV+MP - Dr. Bernarda Caffey   GAS INSERTION Right 03/28/2020   Procedure: INSERTION OF GAS;  Surgeon: Bernarda Caffey, MD;  Location: [facility];  Service: Ophthalmology;  Laterality:  Right;   GAS/FLUID EXCHANGE Right 03/28/2020   Procedure: GAS/FLUID EXCHANGE;  Surgeon: Bernarda Caffey, MD;  Location: [place];  Service: Ophthalmology;  Laterality: Right;   MEMBRANE PEEL Right 03/28/2020   Procedure: MEMBRANE PEEL;  Surgeon: Bernarda Caffey, MD;  Location: [facility];  Service: Ophthalmology;  Laterality: Right;   PARS PLANA VITRECTOMY Right 03/28/2020   Procedure: PARS PLANA VITRECTOMY WITH 25 GAUGE;  Surgeon: Bernarda Caffey, MD;  Location: [place];  Service: Ophthalmology;  Laterality: Right;   SHOULDER SURGERY Right    remove bone spur   WISDOM TOOTH EXTRACTION      FAMILY HISTORY Family History  Problem Relation Age of Onset   Breast cancer Neg Hx     SOCIAL HISTORY Social History   Tobacco Use   Smoking status: Former    Years: 5.00    Pack years: 0.00    Types: Cigarettes   Smokeless tobacco: Never   Tobacco comments:    smoked from age [age] yrs old   Vaping Use   Vaping Use: Never used  Substance Use Topics   Alcohol use: Never   Drug use: Never         OPHTHALMIC EXAM:  Base Eye Exam     Visual Acuity (Snellen - Linear)       Right Left   Dist cc 20/100 20/20   Dist ph cc 20/30          Tonometry (Tonopen, 9:27 AM)       Right Left   Pressure 17 10         Pupils       Dark Light Shape React APD   Right 3 2 Round Brisk None   Left 3 2 Round Brisk None         Visual Fields (Counting fingers)       Left Right    Full Full         Extraocular Movement       Right Left    Full, Ortho Full, Ortho         Neuro/Psych     Oriented x3: Yes   Mood/Affect: Normal         Dilation     Both eyes: 1.0% Mydriacyl, 2.5% Phenylephrine @ 9:27 AM           Slit Lamp and Fundus Exam     Slit Lamp Exam       Right Left   Lids/Lashes Normal Normal   Conjunctiva/Sclera mild melanosis Mild Melanosis   Cornea Trace Punctate epithelial erosions, mild arcus, trace endopigment Trace Punctate epithelial erosions, arcus    Anterior Chamber deep, clear, narrow temporal angle Deep and quiet   Iris Round and dilated Round and dilated   Lens 3+ Nuclear sclerosis, 2-3+ Cortical cataract 2+ Nuclear sclerosis, 2+ Cortical cataract  Vitreous post vitrectomy, gas bubble gone   Vitreous syneresis         Fundus Exam       Right Left   Disc Pink and Sharp, mild PPA/PPP Pink and Sharp, mild early fibrosis   C/D Ratio 0.4 0.4   Macula Flat, Good foveal reflex, ERM and pucker gone, No heme or edema good foveal reflex, ERM with striae SN mac, No heme or edema   Vessels attenuated, mild tortuousity, mild Copper wiring attenuated, Tortuous, mild Copper wiring, mild AV crossing changes   Periphery Attached, mild pigmented cystoid degeneration Attached, mild pigmented cystoid degeneration           Refraction     Wearing Rx       Sphere Cylinder Axis   Right -4.25 Sphere    Left -4.50 +0.50 175            IMAGING AND PROCEDURES  Imaging and Procedures for 10/17/2020  OCT, Retina - OU - Both Eyes       Right Eye Quality was good. Central Foveal Thickness: 361. Progression has been stable. Findings include no IRF, no SRF, normal foveal contour (ERM gone, pucker improved, mild inner retinal surface irregularity -- stable from prior).   Left Eye Quality was good. Central Foveal Thickness: 331. Progression has improved. Findings include no IRF, no SRF, vitreomacular adhesion , epiretinal membrane, macular pucker, abnormal foveal contour (Interval improvement in foveal contour).   Notes *Images captured and stored on drive  Diagnosis / Impression:  OD: NFP; no IRF/SRF; ERM gone, pucker improved OS: mild ERM; Interval improvement in foveal contour  Clinical management:  See below  Abbreviations: NFP - Normal foveal profile. CME - cystoid macular edema. PED - pigment epithelial detachment. IRF - intraretinal fluid. SRF - subretinal fluid. EZ - ellipsoid zone. ERM - epiretinal membrane. ORA - outer  retinal atrophy. ORT - outer retinal tubulation. SRHM - subretinal hyper-reflective material. IRHM - intraretinal hyper-reflective material              ASSESSMENT/PLAN:    ICD-10-CM   1. Epiretinal membrane (ERM) of both eyes  H35.373     2. Retinal edema  H35.81 OCT, Retina - OU - Both Eyes    3. Essential hypertension  I10     4. Hypertensive retinopathy of both eyes  H35.033     5. Combined forms of age-related cataract of both eyes  H25.813       1,2. Epiretinal membrane, OU (OD>OS)  - pre-op BCVA OD: 20/50; OS: 20/20 (MRx) - pt reported generalized blur, mild metamorphopsia OD - OS with focal noncentral ERM w/ pucker, sup nasal macula -- VA remains 20/20 OS  - s/p PPV/TissueBlue/MP/20% SF6 OD, 11.18.21             - doing well -- BCVA 20/30             - ERM gone              - IOP 17 - f/u 4 months -- DFE, OCT  3,4. Hypertensive retinopathy OU - discussed importance of tight BP control - monitor   5. Mixed Cataract OU - The symptoms of cataract, surgical options, and treatments and risks were discussed with patient. - clear from a retina standpoint to proceed with cataract surgery when pt and surgeon are ready - will refer to Dr. Kathlen Mody for cataract consult   Ophthalmic Meds Ordered this visit:  No orders of the defined types  were placed in this encounter.      Return in about 4 months (around 02/16/2021) for f/u ERM OS, DFE, OCT.  There are no Patient Instructions on file for this visit.   Explained the diagnoses, plan, and follow up with the patient and they expressed understanding.  Patient expressed understanding of the importance of proper follow up care.   This document serves as a record of services personally performed by Gardiner Sleeper, MD, PhD. It was created on their behalf by Leonie Douglas, an ophthalmic technician. The creation of this record is the provider's dictation and/or activities during the visit.    Electronically signed by:  Leonie Douglas COA, 10/17/20  11:55 PM   This document serves as a record of services personally performed by Gardiner Sleeper, MD, PhD. It was created on their behalf by San Jetty. Owens Shark, OA an ophthalmic technician. The creation of this record is the provider's dictation and/or activities during the visit.    Electronically signed by: San Jetty. Marguerita Merles 06.09.2022 11:55 PM   Gardiner Sleeper, M.D., Ph.D. Diseases & Surgery of the Retina and Vitreous [facility] 10/17/20  I have reviewed the above documentation for accuracy and completeness, and I agree with the above. Gardiner Sleeper, M.D., Ph.D. 10/17/20 11:55 PM   Abbreviations: M myopia (nearsighted); A astigmatism; H hyperopia (farsighted); P presbyopia; Mrx spectacle prescription;  CTL contact lenses; OD right eye; OS left eye; OU both eyes  XT exotropia; ET esotropia; PEK punctate epithelial keratitis; PEE punctate epithelial erosions; DES dry eye syndrome; MGD meibomian gland dysfunction; ATs artificial tears; PFAT's preservative free artificial tears; [place] nuclear sclerotic cataract; PSC posterior subcapsular cataract; ERM epi-retinal membrane; PVD posterior vitreous detachment; RD retinal detachment; DM diabetes mellitus; DR diabetic retinopathy; NPDR non-proliferative diabetic retinopathy; PDR proliferative diabetic retinopathy; CSME clinically significant macular edema; DME diabetic macular edema; dbh dot blot hemorrhages; CWS cotton wool spot; POAG primary open angle glaucoma; C/D cup-to-disc ratio; HVF humphrey visual field; GVF goldmann visual field; OCT optical coherence tomography; IOP intraocular pressure; BRVO Branch retinal vein occlusion; CRVO central retinal vein occlusion; CRAO central retinal artery occlusion; BRAO branch retinal artery occlusion; RT retinal tear; SB scleral buckle; PPV pars plana vitrectomy; VH Vitreous hemorrhage; PRP panretinal laser photocoagulation; IVK intravitreal kenalog; VMT  vitreomacular traction; MH Macular hole;  NVD neovascularization of the disc; NVE neovascularization elsewhere; AREDS age related eye disease study; ARMD age related macular degeneration; POAG primary open angle glaucoma; EBMD epithelial/anterior basement membrane dystrophy; ACIOL anterior chamber intraocular lens; IOL intraocular lens; PCIOL posterior chamber intraocular lens; Phaco/IOL phacoemulsification with intraocular lens placement; [place] photorefractive keratectomy; LASIK laser assisted in situ keratomileusis; HTN hypertension; DM diabetes mellitus; COPD chronic obstructive pulmonary disease

## 2020-10-17 ENCOUNTER — Ambulatory Visit (INDEPENDENT_AMBULATORY_CARE_PROVIDER_SITE_OTHER): Payer: Medicare Other | Admitting: Ophthalmology

## 2020-10-17 ENCOUNTER — Encounter (INDEPENDENT_AMBULATORY_CARE_PROVIDER_SITE_OTHER): Payer: Self-pay | Admitting: Ophthalmology

## 2020-10-17 ENCOUNTER — Other Ambulatory Visit: Payer: Self-pay

## 2020-10-17 DIAGNOSIS — H3581 Retinal edema: Secondary | ICD-10-CM | POA: Diagnosis not present

## 2020-10-17 DIAGNOSIS — H35033 Hypertensive retinopathy, bilateral: Secondary | ICD-10-CM | POA: Diagnosis not present

## 2020-10-17 DIAGNOSIS — I10 Essential (primary) hypertension: Secondary | ICD-10-CM | POA: Diagnosis not present

## 2020-10-17 DIAGNOSIS — H35373 Puckering of macula, bilateral: Secondary | ICD-10-CM

## 2020-10-17 DIAGNOSIS — H25813 Combined forms of age-related cataract, bilateral: Secondary | ICD-10-CM

## 2020-10-28 DIAGNOSIS — E119 Type 2 diabetes mellitus without complications: Secondary | ICD-10-CM | POA: Diagnosis not present

## 2020-10-31 DIAGNOSIS — E119 Type 2 diabetes mellitus without complications: Secondary | ICD-10-CM | POA: Diagnosis not present

## 2020-11-04 ENCOUNTER — Other Ambulatory Visit: Payer: Federal, State, Local not specified - PPO

## 2020-11-05 DIAGNOSIS — H35033 Hypertensive retinopathy, bilateral: Secondary | ICD-10-CM | POA: Diagnosis not present

## 2020-11-05 DIAGNOSIS — H2511 Age-related nuclear cataract, right eye: Secondary | ICD-10-CM | POA: Diagnosis not present

## 2020-11-05 DIAGNOSIS — H35373 Puckering of macula, bilateral: Secondary | ICD-10-CM | POA: Diagnosis not present

## 2020-11-05 DIAGNOSIS — H25013 Cortical age-related cataract, bilateral: Secondary | ICD-10-CM | POA: Diagnosis not present

## 2020-11-05 DIAGNOSIS — H2513 Age-related nuclear cataract, bilateral: Secondary | ICD-10-CM | POA: Diagnosis not present

## 2020-11-08 ENCOUNTER — Other Ambulatory Visit: Payer: Self-pay

## 2020-11-08 ENCOUNTER — Ambulatory Visit (INDEPENDENT_AMBULATORY_CARE_PROVIDER_SITE_OTHER): Payer: Medicare Other | Admitting: *Deleted

## 2020-11-08 DIAGNOSIS — B351 Tinea unguium: Secondary | ICD-10-CM

## 2020-11-08 DIAGNOSIS — M79674 Pain in right toe(s): Secondary | ICD-10-CM

## 2020-11-08 DIAGNOSIS — M79675 Pain in left toe(s): Secondary | ICD-10-CM

## 2020-11-08 NOTE — Progress Notes (Signed)
Patient presents today for the 3rd laser treatment. Diagnosed with mycotic nail infection by Dr. Logan Bores.   Toenail most affected 1st and 5th bilateral.  All other systems are negative.  Nails were filed thin. Laser therapy was administered to 1st and 5th toenails bilateral and patient tolerated the treatment well. All safety precautions were in place.    Follow up in 6 weeks for laser # 4.

## 2020-11-13 DIAGNOSIS — H2511 Age-related nuclear cataract, right eye: Secondary | ICD-10-CM | POA: Diagnosis not present

## 2020-11-13 DIAGNOSIS — H25811 Combined forms of age-related cataract, right eye: Secondary | ICD-10-CM | POA: Diagnosis not present

## 2020-12-17 ENCOUNTER — Encounter: Payer: Self-pay | Admitting: Dietician

## 2020-12-17 ENCOUNTER — Encounter: Payer: Medicare Other | Attending: Family Medicine | Admitting: Dietician

## 2020-12-17 ENCOUNTER — Other Ambulatory Visit: Payer: Self-pay

## 2020-12-17 DIAGNOSIS — E119 Type 2 diabetes mellitus without complications: Secondary | ICD-10-CM | POA: Diagnosis present

## 2020-12-17 NOTE — Progress Notes (Signed)
Patient was seen on 12/17/2020 for the first of a series of three diabetes self[facility]management courses [facility]  Patient Education Plan per assessed needs and concerns is to attend three course education program for Diabetes Self Management Education.  The following learning objectives were met by the patient during this class: Describe diabetes, types of diabetes and pathophysiology State some common risk factors for diabetes Defines the role of glucose and insulin Describe the relationship between diabetes and cardiovascular and other risks State the members of the Healthcare Team States the rationale for glucose monitoring and when to test State their individual [place] the importance of logging glucose readings and how to interpret the readings Identifies A1C target Explain the correlation between A1c and eAG values State symptoms and treatment of high blood glucose and low blood glucose Explain proper technique for glucose testing and identify proper sharps disposal  Handouts given during class include: How to Thrive:  A Guide for Your Journey with Diabetes by the ADA Meal Plan Card and carbohydrate content list Dietary intake form Low Sodium Flavoring Tips Types of Fats Dining Out Label reading Snack list Planning a balanced meal The diabetes portion plate Diabetes Resources A1c to eAG Conversion Chart Blood Glucose Log Diabetes Recommended Care Schedule Support Group Diabetes Success Plan Core Class Satisfaction Survey   Follow-Up Plan: Attend core 2

## 2020-12-20 ENCOUNTER — Other Ambulatory Visit: Payer: Self-pay

## 2020-12-20 ENCOUNTER — Ambulatory Visit (INDEPENDENT_AMBULATORY_CARE_PROVIDER_SITE_OTHER): Payer: Self-pay | Admitting: *Deleted

## 2020-12-20 DIAGNOSIS — B351 Tinea unguium: Secondary | ICD-10-CM

## 2020-12-20 DIAGNOSIS — M79674 Pain in right toe(s): Secondary | ICD-10-CM

## 2020-12-20 DIAGNOSIS — M79675 Pain in left toe(s): Secondary | ICD-10-CM

## 2020-12-20 NOTE — Progress Notes (Signed)
Patient presents today for the 4th laser treatment. Diagnosed with mycotic nail infection by Dr. Logan Bores.   Toenail most affected 1st and 5th bilateral. The nails are looking much better.  All other systems are negative.  Nails were filed thin. Laser therapy was administered to 1st and 5th toenails bilateral and patient tolerated the treatment well. All safety precautions were in place.    Follow up in 6 weeks for laser # 5.

## 2020-12-24 ENCOUNTER — Encounter: Payer: Self-pay | Admitting: Dietician

## 2020-12-24 ENCOUNTER — Other Ambulatory Visit: Payer: Self-pay

## 2020-12-24 ENCOUNTER — Encounter: Payer: Medicare Other | Admitting: Dietician

## 2020-12-24 DIAGNOSIS — E119 Type 2 diabetes mellitus without complications: Secondary | ICD-10-CM

## 2020-12-24 NOTE — Progress Notes (Signed)
Patient was seen on 12/24/2020 for the second of a series of three diabetes self-management courses at the [facility]. The following learning objectives were met by the patient during this class:  Describe the role of different macronutrients on glucose Explain how carbohydrates affect blood glucose State what foods contain the most carbohydrates Demonstrate carbohydrate counting Demonstrate how to read Nutrition Facts food label Describe effects of various fats on heart health Describe the importance of good nutrition for health and healthy eating strategies Describe techniques for managing your shopping, cooking and meal planning List strategies to follow meal plan when dining out Describe the effects of alcohol on glucose and how to use it safely  Goals:  Follow Diabetes Meal Plan as instructed  Aim to spread carbs evenly throughout the day  Aim for 3 meals per day and snacks as needed Include lean protein foods to meals/snacks  Monitor glucose levels as instructed by your doctor   Follow-Up Plan: Attend Core 3 Work towards following your personal food plan.   

## 2020-12-31 ENCOUNTER — Encounter: Payer: Medicare Other | Admitting: Dietician

## 2020-12-31 ENCOUNTER — Other Ambulatory Visit: Payer: Self-pay

## 2020-12-31 ENCOUNTER — Encounter: Payer: Self-pay | Admitting: Dietician

## 2020-12-31 DIAGNOSIS — E119 Type 2 diabetes mellitus without complications: Secondary | ICD-10-CM

## 2020-12-31 NOTE — Progress Notes (Signed)
Patient was seen on 12/31/2020 for the third of a series of three diabetes self-management courses at [facility].   State the amount of activity recommended for healthy living Describe activities suitable for individual needs Identify ways to regularly incorporate activity into daily life Identify barriers to activity and ways to over come these barriers Identify diabetes medications being personally used and their primary action for lowering glucose and possible side effects Describe role of stress on blood glucose and develop strategies to address psychosocial issues Identify diabetes complications and ways to prevent them Explain how to manage diabetes during illness Evaluate success in meeting personal goal Establish 2-3 goals that they will plan to diligently work on  Goals:  I will count my carb choices at most meals and snacks I will be active 45 minutes or more 6 times a week I will eat less unhealthy fats To help manage stress I will  relax when necessary  Your patient has identified these potential barriers to change:  none  Your patient has identified their diabetes self-care support plan as   On-line Resources  Plan:  Attend Support Group as desired

## 2021-02-07 ENCOUNTER — Other Ambulatory Visit: Payer: Self-pay

## 2021-02-07 ENCOUNTER — Ambulatory Visit (INDEPENDENT_AMBULATORY_CARE_PROVIDER_SITE_OTHER): Payer: Self-pay | Admitting: *Deleted

## 2021-02-07 DIAGNOSIS — B351 Tinea unguium: Secondary | ICD-10-CM

## 2021-02-07 DIAGNOSIS — M79675 Pain in left toe(s): Secondary | ICD-10-CM

## 2021-02-07 DIAGNOSIS — M79674 Pain in right toe(s): Secondary | ICD-10-CM

## 2021-02-07 NOTE — Progress Notes (Signed)
Patient presents today for the 5th laser treatment. Diagnosed with mycotic nail infection by Dr. Logan Bores.   Toenail most affected 1st and 5th bilateral. The nails are looking much better and she is very pleased.  All other systems are negative.  Nails were filed thin. Laser therapy was administered to 1st and 5th toenails bilateral and patient tolerated the treatment well. All safety precautions were in place.    Follow up in 8 weeks for laser # 6

## 2021-02-10 ENCOUNTER — Other Ambulatory Visit: Payer: Self-pay | Admitting: Family Medicine

## 2021-02-10 DIAGNOSIS — Z1231 Encounter for screening mammogram for malignant neoplasm of breast: Secondary | ICD-10-CM

## 2021-02-11 NOTE — Progress Notes (Signed)
[facility] - Clinic Note  02/17/2021     CHIEF COMPLAINT Patient presents for Retina Follow Up   HISTORY OF PRESENT ILLNESS: Teresa Fernandez is a 65 y.o. female who presents to the clinic today for:  HPI     Retina Follow Up   Patient presents with  Other.  In both eyes.  This started months ago.  Severity is moderate.  Duration of months.  Since onset it is stable.  I, the attending physician,  performed the HPI with the patient and updated documentation appropriately.        Comments   Pt states vision seems the same--maybe a little better since getting new glasses.  Pt denies eye pain or discomfort.  Pt denies new or worsening floaters or fol OU.      Last edited by Rennis Chris, MD on 02/17/2021 11:14 AM.    Pt states she had OD cataract sx with Dr. Alben Spittle, no plans to do OS yet, no problems with OD after sx, pt happy with vision  Referring physician: Fredrich Birks, OD [address] [place] [address]  HISTORICAL INFORMATION:   Selected notes from the MEDICAL RECORD NUMBER Referred by Dr. Fredrich Birks   CURRENT MEDICATIONS: Current Outpatient Medications (Ophthalmic Drugs)  Medication Sig   bacitracin-polymyxin b (POLYSPORIN) ophthalmic ointment Place into the right eye at bedtime. Place a 1/2 inch ribbon of ointment into the lower eyelid at bedtime and as needed (Patient not taking: Reported on 06/18/2020)   prednisoLONE acetate (PRED FORTE) 1 % ophthalmic suspension Place 1 drop into the right eye 6 (six) times daily. (Patient not taking: Reported on 06/18/2020)   No current facility-administered medications for this visit. (Ophthalmic Drugs)   Current Outpatient Medications (Other)  Medication Sig   acetaminophen (TYLENOL) 325 MG tablet Take 325-650 mg by mouth every 6 (six) hours as needed for moderate pain or headache.   amLODipine (NORVASC) 2.5 MG tablet Take 2.5 mg by mouth daily.   atorvastatin (LIPITOR) 20 MG tablet Take  20 mg by mouth daily.   Calcium Carbonate-Vit D-Min (CALCIUM 1200 PO) Take 1 tablet by mouth daily.   cetirizine (ZYRTEC) 10 MG tablet Take 10 mg by mouth daily as needed for allergies.   cholecalciferol (VITAMIN D3) 25 MCG (1000 UNIT) tablet Take 1,000 Units by mouth daily.   Cinnamon 500 MG capsule Take 500 mg by mouth daily.   co-enzyme Q-10 30 MG capsule Take 30 mg by mouth 3 (three) times daily.   ECHINACEA PO Take 2 tablets by mouth daily as needed (immune support).   fluocinonide ointment (LIDEX) 0.05 % Apply 1 application topically daily as needed (eczema).    fluticasone (FLONASE) 50 MCG/ACT nasal spray Place 1 spray into both nostrils daily as needed for allergies or rhinitis.   Homeopathic Products (ZICAM ALLERGY RELIEF NA) Place into the nose.   hydrochlorothiazide (HYDRODIURIL) 25 MG tablet Take 25 mg by mouth daily.    MAGNESIUM PO Take 1 tablet by mouth daily. With ashwagandha   Melatonin 1 MG/4ML LIQD Take by mouth.   Multiple Vitamin (MULTIVITAMIN WITH MINERALS) TABS tablet Take 1 tablet by mouth daily.   Potassium 99 MG TABS Take 99 mg by mouth every other day.   Turmeric 1053 MG TABS Take by mouth.   No current facility-administered medications for this visit. (Other)   REVIEW OF SYSTEMS: ROS   Positive for: Gastrointestinal, Cardiovascular, Eyes, Respiratory Negative for: Constitutional, Neurological, Skin, Genitourinary, Musculoskeletal,  HENT, Endocrine, Psychiatric, Allergic/Imm, Heme/Lymph Last edited by Corrinne Eagle on 02/17/2021  9:13 AM.    ALLERGIES No Known Allergies  PAST MEDICAL HISTORY Past Medical History:  Diagnosis Date   Cataract    Mixed form OU   Diabetes mellitus without complication (HCC)    GERD (gastroesophageal reflux disease)    occasional - diet controlled, no meds   Heart murmur    heard by PCP per pt on 03/27/20, PCP never referred pt to see cardiologist   HLD (hyperlipidemia)    Hypertension    Hypertensive retinopathy    OU    Seasonal allergies    Sleep apnea    uses cpap nightly   Past Surgical History:  Procedure Laterality Date   COLONOSCOPY     EYE SURGERY Right 03/28/2020   PPV+MP - Dr. Rennis Chris   GAS INSERTION Right 03/28/2020   Procedure: INSERTION OF GAS;  Surgeon: Rennis Chris, MD;  Location: [facility] OR;  Service: Ophthalmology;  Laterality: Right;   GAS/FLUID EXCHANGE Right 03/28/2020   Procedure: GAS/FLUID EXCHANGE;  Surgeon: Rennis Chris, MD;  Location: [facility] OR;  Service: Ophthalmology;  Laterality: Right;   MEMBRANE PEEL Right 03/28/2020   Procedure: MEMBRANE PEEL;  Surgeon: Rennis Chris, MD;  Location: [facility] OR;  Service: Ophthalmology;  Laterality: Right;   PARS PLANA VITRECTOMY Right 03/28/2020   Procedure: PARS PLANA VITRECTOMY WITH 25 GAUGE;  Surgeon: Rennis Chris, MD;  Location: [facility] OR;  Service: Ophthalmology;  Laterality: Right;   SHOULDER SURGERY Right    remove bone spur   WISDOM TOOTH EXTRACTION     FAMILY HISTORY Family History  Problem Relation Age of Onset   Breast cancer Neg Hx    SOCIAL HISTORY Social History   Tobacco Use   Smoking status: Former    Years: 5.00    Types: Cigarettes   Smokeless tobacco: Never   Tobacco comments:    smoked from age [age] yrs old   Vaping Use   Vaping Use: Never used  Substance Use Topics   Alcohol use: Never   Drug use: Never       OPHTHALMIC EXAM:  Base Eye Exam     Visual Acuity (Snellen - Linear)       Right Left   Dist cc 20/20 20/20 -2    Correction: Glasses         Tonometry (Tonopen, 9:24 AM)       Right Left   Pressure 14 14         Pupils       Dark Light Shape React APD   Right 4 3 Round Brisk 0   Left 4 3 Round Brisk 0         Visual Fields       Left Right    Full Full         Extraocular Movement       Right Left    Full Full         Neuro/Psych     Oriented x3: Yes   Mood/Affect: Normal         Dilation     Both eyes: 1.0% Mydriacyl, 2.5% Phenylephrine @ 9:24 AM            Slit Lamp and Fundus Exam     Slit Lamp Exam       Right Left   Lids/Lashes Normal Normal   Conjunctiva/Sclera mild melanosis Mild Melanosis   Cornea Trace Punctate epithelial erosions,  mild arcus, trace endopigment, well healed cataract wound Trace Punctate epithelial erosions, arcus, faint, fine endo pigment   Anterior Chamber deep, clear, narrow temporal angle Deep and quiet   Iris Round and reactive Round and reactive   Lens PC IOL in good position 2+ Nuclear sclerosis, 2+ Cortical cataract   Vitreous post vitrectomy Vitreous syneresis         Fundus Exam       Right Left   Disc Pink and Sharp, mild PPA/PPP Pink and Sharp, mild early fibrosis   C/D Ratio 0.4 0.4   Macula Flat, Good foveal reflex, ERM and pucker gone, No heme or edema good foveal reflex, ERM with striae superior and temporal macula, No heme or edema   Vessels attenuated, mild tortuousity, mild Copper wiring, mild AV crossing changes attenuated, Tortuous, mild Copper wiring, mild AV crossing changes   Periphery Attached, mild pigmented cystoid degeneration Attached, mild pigmented cystoid degeneration           Refraction     Wearing Rx       Sphere Cylinder Axis Add   Right -2.00 +1.00 120 +2.50   Left -4.50 +0.50 175 +2.50    Age: 37 MO   Type: PROG           IMAGING AND PROCEDURES  Imaging and Procedures for 02/17/2021  OCT, Retina - OU - Both Eyes       Right Eye Quality was good. Central Foveal Thickness: 355. Progression has been stable. Findings include no IRF, no SRF, normal foveal contour (ERM gone, pucker improved, mild inner retinal surface irregularity -- stable from prior).   Left Eye Quality was good. Central Foveal Thickness: 286. Progression has been stable. Findings include no IRF, no SRF, epiretinal membrane, macular pucker, normal foveal contour (Mild ERM with interval thickening of temporal fovea; foveal contour remains good, partial PVD).    Notes *Images captured and stored on drive  Diagnosis / Impression:  OD: NFP; no IRF/SRF; ERM gone, pucker improved OS: Mild ERM with interval thickening of temporal fovea; foveal contour remains good, partial PVD  Clinical management:  See below  Abbreviations: NFP - Normal foveal profile. CME - cystoid macular edema. PED - pigment epithelial detachment. IRF - intraretinal fluid. SRF - subretinal fluid. EZ - ellipsoid zone. ERM - epiretinal membrane. ORA - outer retinal atrophy. ORT - outer retinal tubulation. SRHM - subretinal hyper-reflective material. IRHM - intraretinal hyper-reflective material            ASSESSMENT/PLAN:    ICD-10-CM   1. Epiretinal membrane (ERM) of both eyes  H35.373     2. Retinal edema  H35.81 OCT, Retina - OU - Both Eyes    3. Essential hypertension  I10     4. Hypertensive retinopathy of both eyes  H35.033     5. Combined forms of age-related cataract of left eye  H25.812     6. Pseudophakia  Z96.1       1,2. Epiretinal membrane, OU (OD>OS)  - pre-op BCVA OD: 20/50; OS: 20/20 (MRx) - pt reported generalized blur, mild metamorphopsia OD - OS with focal noncentral ERM w/ pucker, sup nasal macula -- VA remains 20/20 OS  - s/p PPV/TissueBlue/MP/20% SF6 OD, 11.18.21             - doing well -- BCVA 20/20 post cataract surgery             - ERM gone              -  IOP 14 - f/u 4 months -- DFE, OCT  3,4. Hypertensive retinopathy OU - discussed importance of tight BP control - monitor   5. Mixed Cataract OS - The symptoms of cataract, surgical options, and treatments and risks were discussed with patient. - discussed diagnosis and progression - not yet visually significant - monitor  6. Pseudophakia OD  - s/p CE/IOL OD (Dr. Alben Spittle, 7.6.22)  - IOL in good position, doing well  - monitor  Ophthalmic Meds Ordered this visit:  No orders of the defined types were placed in this encounter.    Return in about 4 months (around  06/20/2021) for f/u ERM OU, DFE, OCT.  There are no Patient Instructions on file for this visit.  This document serves as a record of services personally performed by Karie Chimera, MD, PhD. It was created on their behalf by Herby Abraham, COA, an ophthalmic technician. The creation of this record is the provider's dictation and/or activities during the visit.    Electronically signed by: Herby Abraham, COA @TODAY @ 11:15 AM  This document serves as a record of services personally performed by , MD, PhD. It was created on their behalf by Karie Chimera. Glee Arvin, OA an ophthalmic technician. The creation of this record is the provider's dictation and/or activities during the visit.    Electronically signed by: Manson Passey. [place], Tobel 10.10.2022 11:15 AM  12.10.2022, M.D., Ph.D. Diseases & Surgery of the Retina and Vitreous [facility] 02/17/2021   I have reviewed the above documentation for accuracy and completeness, and I agree with the above. 04/19/2021, M.D., Ph.D. 02/17/21 11:18 AM  Abbreviations: M myopia (nearsighted); A astigmatism; H hyperopia (farsighted); P presbyopia; Mrx spectacle prescription;  CTL contact lenses; OD right eye; OS left eye; OU both eyes  XT exotropia; ET esotropia; PEK punctate epithelial keratitis; PEE punctate epithelial erosions; DES dry eye syndrome; MGD meibomian gland dysfunction; ATs artificial tears; PFAT's preservative free artificial tears; NSC nuclear sclerotic cataract; PSC posterior subcapsular cataract; ERM epi-retinal membrane; PVD posterior vitreous detachment; RD retinal detachment; DM diabetes mellitus; DR diabetic retinopathy; NPDR non-proliferative diabetic retinopathy; PDR proliferative diabetic retinopathy; CSME clinically significant macular edema; DME diabetic macular edema; dbh dot blot hemorrhages; CWS cotton wool spot; POAG primary open angle glaucoma; C/D cup-to-disc ratio; HVF humphrey visual field;  GVF goldmann visual field; OCT optical coherence tomography; IOP intraocular pressure; BRVO Branch retinal vein occlusion; CRVO central retinal vein occlusion; CRAO central retinal artery occlusion; BRAO branch retinal artery occlusion; RT retinal tear; SB scleral buckle; PPV pars plana vitrectomy; VH Vitreous hemorrhage; PRP panretinal laser photocoagulation; IVK intravitreal kenalog; VMT vitreomacular traction; MH Macular hole;  NVD neovascularization of the disc; NVE neovascularization elsewhere; AREDS age related eye disease study; ARMD age related macular degeneration; POAG primary open angle glaucoma; EBMD epithelial/anterior basement membrane dystrophy; ACIOL anterior chamber intraocular lens; IOL intraocular lens; PCIOL posterior chamber intraocular lens; Phaco/IOL phacoemulsification with intraocular lens placement; PRK photorefractive keratectomy; LASIK laser assisted in situ keratomileusis; HTN hypertension; DM diabetes mellitus; COPD chronic obstructive pulmonary disease

## 2021-02-17 ENCOUNTER — Encounter (INDEPENDENT_AMBULATORY_CARE_PROVIDER_SITE_OTHER): Payer: Self-pay | Admitting: Ophthalmology

## 2021-02-17 ENCOUNTER — Ambulatory Visit (INDEPENDENT_AMBULATORY_CARE_PROVIDER_SITE_OTHER): Payer: Medicare Other | Admitting: Ophthalmology

## 2021-02-17 ENCOUNTER — Other Ambulatory Visit: Payer: Self-pay

## 2021-02-17 DIAGNOSIS — I10 Essential (primary) hypertension: Secondary | ICD-10-CM | POA: Diagnosis not present

## 2021-02-17 DIAGNOSIS — H35373 Puckering of macula, bilateral: Secondary | ICD-10-CM

## 2021-02-17 DIAGNOSIS — H35033 Hypertensive retinopathy, bilateral: Secondary | ICD-10-CM | POA: Diagnosis not present

## 2021-02-17 DIAGNOSIS — H25812 Combined forms of age-related cataract, left eye: Secondary | ICD-10-CM

## 2021-02-17 DIAGNOSIS — H3581 Retinal edema: Secondary | ICD-10-CM

## 2021-02-17 DIAGNOSIS — Z961 Presence of intraocular lens: Secondary | ICD-10-CM

## 2021-02-17 DIAGNOSIS — H25813 Combined forms of age-related cataract, bilateral: Secondary | ICD-10-CM

## 2021-03-03 ENCOUNTER — Ambulatory Visit: Payer: Medicare Other | Attending: Internal Medicine

## 2021-03-03 ENCOUNTER — Other Ambulatory Visit: Payer: Self-pay

## 2021-03-03 ENCOUNTER — Other Ambulatory Visit (HOSPITAL_BASED_OUTPATIENT_CLINIC_OR_DEPARTMENT_OTHER): Payer: Self-pay

## 2021-03-03 DIAGNOSIS — Z23 Encounter for immunization: Secondary | ICD-10-CM

## 2021-03-03 MED ORDER — PFIZER COVID-19 VAC BIVALENT 30 MCG/0.3ML IM SUSP
INTRAMUSCULAR | 0 refills | Status: AC
  Filled 2021-03-03: qty 0.3, 1d supply, fill #0

## 2021-03-03 NOTE — Progress Notes (Signed)
   Covid-19 Vaccination Clinic  Name:  Anissia Wessells    MRN: [id number] DOB: 11/09/1955  03/03/2021  Ms. Aber was observed post Covid-19 immunization for 15 minutes without incident. She was provided with Vaccine Information Sheet and instruction to access the V-Safe system.   Ms. Stagliano was instructed to call 911 with any severe reactions post vaccine: Difficulty breathing  Swelling of face and throat  A fast heartbeat  A bad rash all over body  Dizziness and weakness   Immunizations Administered     Name Date Dose VIS Date Route   Pfizer Covid-19 Vaccine Bivalent Booster 03/03/2021  2:06 PM 0.3 mL 01/08/2021 Intramuscular   Manufacturer: [employer], [employer]   Lot: [id number]   NDC: [phone]

## 2021-03-14 ENCOUNTER — Ambulatory Visit
Admission: RE | Admit: 2021-03-14 | Discharge: 2021-03-14 | Disposition: A | Discharge status: Home / Self Care | Payer: Medicare Other | Source: Ambulatory Visit | Attending: Family Medicine | Admitting: Family Medicine

## 2021-03-14 ENCOUNTER — Other Ambulatory Visit: Payer: Self-pay

## 2021-03-14 DIAGNOSIS — Z1231 Encounter for screening mammogram for malignant neoplasm of breast: Secondary | ICD-10-CM

## 2021-03-28 ENCOUNTER — Other Ambulatory Visit: Payer: Self-pay

## 2021-03-28 ENCOUNTER — Ambulatory Visit (INDEPENDENT_AMBULATORY_CARE_PROVIDER_SITE_OTHER): Payer: Self-pay | Admitting: *Deleted

## 2021-03-28 DIAGNOSIS — M79675 Pain in left toe(s): Secondary | ICD-10-CM

## 2021-03-28 DIAGNOSIS — M79674 Pain in right toe(s): Secondary | ICD-10-CM

## 2021-03-28 DIAGNOSIS — B351 Tinea unguium: Secondary | ICD-10-CM

## 2021-03-28 NOTE — Progress Notes (Signed)
Patient presents today for the 6th laser treatment. Diagnosed with mycotic nail infection by Dr. Logan Bores.   Toenail most affected 1st and 5th bilateral. The nails are looking much better and she is very pleased.  All other systems are negative.  Nails were filed thin. Laser therapy was administered to 1st and 5th toenails bilateral and patient tolerated the treatment well. All safety precautions were in place.    Patient has completed the recommended laser treatments. He will follow up with Dr. Logan Bores in 3 months to evaluate progress.

## 2021-04-04 ENCOUNTER — Other Ambulatory Visit: Payer: Medicare Other

## 2021-06-18 NOTE — Progress Notes (Signed)
[facility] - Clinic Note  06/20/2021     CHIEF COMPLAINT Patient presents for Retina Follow Up    HISTORY OF PRESENT ILLNESS: Teresa Fernandez is a 66 y.o. female who presents to the clinic today for:  HPI     Retina Follow Up   Patient presents with  Other.  In both eyes.  This started 4 months ago.  I, the attending physician,  performed the HPI with the patient and updated documentation appropriately.        Comments   Patient here for 4 months retina follow up for ERM OU. Patient states vision doing good. No eye pain.       Last edited by Rennis Chris, MD on 06/21/2021  2:10 AM.     Pt states no changes in vision, she wants her glasses changed in [place] when she goes to see Dr. Lorin Picket  Referring physician: Fredrich Birks, OD [address] [place] [address]  HISTORICAL INFORMATION:   Selected notes from the MEDICAL RECORD NUMBER Referred by Dr. Fredrich Birks   CURRENT MEDICATIONS: Current Outpatient Medications (Ophthalmic Drugs)  Medication Sig   bacitracin-polymyxin b (POLYSPORIN) ophthalmic ointment Place into the right eye at bedtime. Place a 1/2 inch ribbon of ointment into the lower eyelid at bedtime and as needed (Patient not taking: Reported on 06/18/2020)   prednisoLONE acetate (PRED FORTE) 1 % ophthalmic suspension Place 1 drop into the right eye 6 (six) times daily. (Patient not taking: Reported on 06/18/2020)   No current facility-administered medications for this visit. (Ophthalmic Drugs)   Current Outpatient Medications (Other)  Medication Sig   acetaminophen (TYLENOL) 325 MG tablet Take 325-650 mg by mouth every 6 (six) hours as needed for moderate pain or headache.   amLODipine (NORVASC) 2.5 MG tablet Take 2.5 mg by mouth daily.   atorvastatin (LIPITOR) 20 MG tablet Take 20 mg by mouth daily.   Calcium Carbonate-Vit D-Min (CALCIUM 1200 PO) Take 1 tablet by mouth daily.   cetirizine (ZYRTEC) 10 MG tablet Take 10 mg by  mouth daily as needed for allergies.   cholecalciferol (VITAMIN D3) 25 MCG (1000 UNIT) tablet Take 1,000 Units by mouth daily.   Cinnamon 500 MG capsule Take 500 mg by mouth daily.   co-enzyme Q-10 30 MG capsule Take 30 mg by mouth 3 (three) times daily.   COVID-19 mRNA bivalent vaccine, Pfizer, (PFIZER COVID-19 VAC BIVALENT) injection Inject into the muscle.   ECHINACEA PO Take 2 tablets by mouth daily as needed (immune support).   fluocinonide ointment (LIDEX) 0.05 % Apply 1 application topically daily as needed (eczema).    fluticasone (FLONASE) 50 MCG/ACT nasal spray Place 1 spray into both nostrils daily as needed for allergies or rhinitis.   Homeopathic Products (ZICAM ALLERGY RELIEF NA) Place into the nose.   hydrochlorothiazide (HYDRODIURIL) 25 MG tablet Take 25 mg by mouth daily.    Melatonin 1 MG/4ML LIQD Take by mouth.   Multiple Vitamin (MULTIVITAMIN WITH MINERALS) TABS tablet Take 1 tablet by mouth daily.   Potassium 99 MG TABS Take 99 mg by mouth every other day.   Turmeric 1053 MG TABS Take by mouth.   MAGNESIUM PO Take 1 tablet by mouth daily. With ashwagandha   No current facility-administered medications for this visit. (Other)   REVIEW OF SYSTEMS: ROS   Positive for: Gastrointestinal, Cardiovascular, Eyes, Respiratory Negative for: Constitutional, Neurological, Skin, Genitourinary, Musculoskeletal, HENT, Endocrine, Psychiatric, Allergic/Imm, Heme/Lymph Last edited by Betsey Holiday  S, COA on 06/20/2021  9:39 AM.     ALLERGIES No Known Allergies  PAST MEDICAL HISTORY Past Medical History:  Diagnosis Date   Cataract    Mixed form OU   Diabetes mellitus without complication (HCC)    GERD (gastroesophageal reflux disease)    occasional - diet controlled, no meds   Heart murmur    heard by PCP per pt on 03/27/20, PCP never referred pt to see cardiologist   HLD (hyperlipidemia)    Hypertension    Hypertensive retinopathy    OU   Seasonal allergies    Sleep  apnea    uses cpap nightly   Past Surgical History:  Procedure Laterality Date   COLONOSCOPY     EYE SURGERY Right 03/28/2020   PPV+MP - Dr. Rennis ChrisBrian Zackari Ruane   GAS INSERTION Right 03/28/2020   Procedure: INSERTION OF GAS;  Surgeon: Rennis ChrisZamora, Trevontae Lindahl, MD;  [facility];  Service: Ophthalmology;  Laterality: Right;   GAS/FLUID EXCHANGE Right 03/28/2020   Procedure: GAS/FLUID EXCHANGE;  Surgeon: Rennis ChrisZamora, Chonda Baney, MD;  [facility];  Service: Ophthalmology;  Laterality: Right;   MEMBRANE PEEL Right 03/28/2020   Procedure: MEMBRANE PEEL;  Surgeon: Rennis ChrisZamora, Jashae Wiggs, MD;  [facility];  Service: Ophthalmology;  Laterality: Right;   PARS PLANA VITRECTOMY Right 03/28/2020   Procedure: PARS PLANA VITRECTOMY WITH 25 GAUGE;  Surgeon: Rennis ChrisZamora, Lailie Smead, MD;  [facility];  Service: Ophthalmology;  Laterality: Right;   SHOULDER SURGERY Right    remove bone spur   WISDOM TOOTH EXTRACTION     FAMILY HISTORY Family History  Problem Relation Age of Onset   Breast cancer Neg Hx    SOCIAL HISTORY Social History   Tobacco Use   Smoking status: Former    Years: 5.00    Types: Cigarettes   Smokeless tobacco: Never   Tobacco comments:    smoked from age [age] yrs old   Vaping Use   Vaping Use: Never used  Substance Use Topics   Alcohol use: Never   Drug use: Never       OPHTHALMIC EXAM:  Base Eye Exam     Visual Acuity (Snellen - Linear)       Right Left   Dist cc 20/20 20/20 -1    Correction: Glasses         Tonometry (Tonopen, 9:36 AM)       Right Left   Pressure 11 11         Pupils       Dark Light Shape React APD   Right 4 3 Round Brisk None   Left 4 3 Round Brisk None         Visual Fields (Counting fingers)       Left Right    Full Full         Extraocular Movement       Right Left    Full, Ortho Full, Ortho         Neuro/Psych     Oriented x3: Yes   Mood/Affect: Normal         Dilation     Both eyes: 1.0% Mydriacyl, 2.5% Phenylephrine @  9:36 AM           Slit Lamp and Fundus Exam     Slit Lamp Exam       Right Left   Lids/Lashes Normal Normal   Conjunctiva/Sclera mild melanosis Mild Melanosis   Cornea Trace Punctate epithelial erosions, mild arcus, trace endopigment,  well healed cataract wound Trace Punctate epithelial erosions, arcus, faint, fine endo pigment   Anterior Chamber deep, clear, narrow temporal angle Deep and quiet   Iris Round and dilated Round and reactive   Lens PC IOL in good position 2-3+ Nuclear sclerosis, 2-3+ Cortical cataract   Anterior Vitreous post vitrectomy Vitreous syneresis         Fundus Exam       Right Left   Disc trace pallor, Sharp rim, mild PPA/PPP Pink and Sharp, mild early fibrosis   C/D Ratio 0.5 0.4   Macula Flat, Good foveal reflex, ERM and pucker gone, No heme or edema good foveal reflex, ERM with striae superior and temporal macula, No heme or edema   Vessels mild attenuation, mild tortuousity, mild Copper wiring attenuated, Tortuous, mild Copper wiring   Periphery Attached, mild pigmented cystoid degeneration Attached, mild pigmented cystoid degeneration           Refraction     Wearing Rx       Sphere Cylinder Axis Add   Right -1.75 +1.25 113 +2.50   Left -3.75 +0.50 055 +2.50    Type: PROG  New glasses after cataract surgery          IMAGING AND PROCEDURES  Imaging and Procedures for 06/20/2021  OCT, Retina - OU - Both Eyes       Right Eye Quality was good. Central Foveal Thickness: 359. Progression has been stable. Findings include no IRF, no SRF, normal foveal contour (ERM gone, pucker improved, mild inner retinal surface irregularity -- stable from prior).   Left Eye Quality was good. Central Foveal Thickness: 270. Progression has been stable. Findings include no IRF, no SRF, epiretinal membrane, macular pucker, normal foveal contour (Mild ERM with stable thickening of temporal fovea; foveal contour remains good, stable pucker, partial PVD).    Notes *Images captured and stored on drive  Diagnosis / Impression:  OD: NFP; no IRF/SRF; ERM gone, pucker improved OS: Mild ERM with stable thickening of temporal fovea; foveal contour remains good, stable pucker, partial PVD  Clinical management:  See below  Abbreviations: NFP - Normal foveal profile. CME - cystoid macular edema. PED - pigment epithelial detachment. IRF - intraretinal fluid. SRF - subretinal fluid. EZ - ellipsoid zone. ERM - epiretinal membrane. ORA - outer retinal atrophy. ORT - outer retinal tubulation. SRHM - subretinal hyper-reflective material. IRHM - intraretinal hyper-reflective material             ASSESSMENT/PLAN:    ICD-10-CM   1. Epiretinal membrane (ERM) of both eyes  H35.373 OCT, Retina - OU - Both Eyes    2. Essential hypertension  I10     3. Hypertensive retinopathy of both eyes  H35.033     4. Combined forms of age-related cataract of left eye  H25.812     5. Pseudophakia  Z96.1        1. Epiretinal membrane, OU (OD>OS)  - pre-op BCVA OD: 20/50; OS: 20/20 (MRx) - pt reported generalized blur, mild metamorphopsia OD - OS with Mild ERM with stable thickening of temporal fovea; foveal contour remains good, stable pucker, partial PVD -- VA remains 20/20 OS  - s/p PPV/TissueBlue/MP/20% SF6 OD, 11.18.21             - doing well -- BCVA 20/20 post cataract surgery              - ERM gone              -  IOP 11 - f/u 6-9 months -- DFE, OCT  2,3. Hypertensive retinopathy OU - discussed importance of tight BP control - monitor   4. Mixed Cataract OS - The symptoms of cataract, surgical options, and treatments and risks were discussed with patient. - discussed diagnosis and progression - not yet visually significant - monitor   5. Pseudophakia OD  - s/p CE/IOL OD (Dr. Alben Spittle, 7.6.22)  - IOL in good position, doing well  - monitor   Ophthalmic Meds Ordered this visit:  No orders of the defined types were placed in this  encounter.     Return for f/u 6-9 months.  There are no Patient Instructions on file for this visit.  This document serves as a record of services personally performed by Karie Chimera, MD, PhD. It was created on their behalf by Joni Reining, an ophthalmic technician. The creation of this record is the provider's dictation and/or activities during the visit.    Electronically signed by: Joni Reining COA, 06/21/21  2:14 AM   Karie Chimera, M.D., Ph.D. Diseases & Surgery of the Retina and Vitreous [facility]  I have reviewed the above documentation for accuracy and completeness, and I agree with the above. Karie Chimera, M.D., Ph.D. 06/21/21 2:16 AM  Abbreviations: M myopia (nearsighted); A astigmatism; H hyperopia (farsighted); P presbyopia; Mrx spectacle prescription;  CTL contact lenses; OD right eye; OS left eye; OU both eyes  XT exotropia; ET esotropia; PEK punctate epithelial keratitis; PEE punctate epithelial erosions; DES dry eye syndrome; MGD meibomian gland dysfunction; ATs artificial tears; PFAT's preservative free artificial tears; NSC nuclear sclerotic cataract; PSC posterior subcapsular cataract; ERM epi-retinal membrane; PVD posterior vitreous detachment; RD retinal detachment; DM diabetes mellitus; DR diabetic retinopathy; NPDR non-proliferative diabetic retinopathy; PDR proliferative diabetic retinopathy; CSME clinically significant macular edema; DME diabetic macular edema; dbh dot blot hemorrhages; CWS cotton wool spot; POAG primary open angle glaucoma; C/D cup-to-disc ratio; HVF humphrey visual field; GVF goldmann visual field; OCT optical coherence tomography; IOP intraocular pressure; BRVO Branch retinal vein occlusion; CRVO central retinal vein occlusion; CRAO central retinal artery occlusion; BRAO branch retinal artery occlusion; RT retinal tear; SB scleral buckle; PPV pars plana vitrectomy; VH Vitreous hemorrhage; PRP panretinal laser  photocoagulation; IVK intravitreal kenalog; VMT vitreomacular traction; MH Macular hole;  NVD neovascularization of the disc; NVE neovascularization elsewhere; AREDS age related eye disease study; ARMD age related macular degeneration; POAG primary open angle glaucoma; EBMD epithelial/anterior basement membrane dystrophy; ACIOL anterior chamber intraocular lens; IOL intraocular lens; PCIOL posterior chamber intraocular lens; Phaco/IOL phacoemulsification with intraocular lens placement; PRK photorefractive keratectomy; LASIK laser assisted in situ keratomileusis; HTN hypertension; DM diabetes mellitus; COPD chronic obstructive pulmonary disease

## 2021-06-20 ENCOUNTER — Ambulatory Visit (INDEPENDENT_AMBULATORY_CARE_PROVIDER_SITE_OTHER): Payer: Medicare Other | Admitting: Ophthalmology

## 2021-06-20 ENCOUNTER — Encounter (INDEPENDENT_AMBULATORY_CARE_PROVIDER_SITE_OTHER): Payer: Self-pay | Admitting: Ophthalmology

## 2021-06-20 ENCOUNTER — Other Ambulatory Visit: Payer: Self-pay

## 2021-06-20 DIAGNOSIS — H25812 Combined forms of age-related cataract, left eye: Secondary | ICD-10-CM | POA: Diagnosis not present

## 2021-06-20 DIAGNOSIS — I10 Essential (primary) hypertension: Secondary | ICD-10-CM | POA: Diagnosis not present

## 2021-06-20 DIAGNOSIS — H35373 Puckering of macula, bilateral: Secondary | ICD-10-CM

## 2021-06-20 DIAGNOSIS — H35033 Hypertensive retinopathy, bilateral: Secondary | ICD-10-CM | POA: Diagnosis not present

## 2021-06-20 DIAGNOSIS — Z961 Presence of intraocular lens: Secondary | ICD-10-CM

## 2021-06-21 ENCOUNTER — Encounter (INDEPENDENT_AMBULATORY_CARE_PROVIDER_SITE_OTHER): Payer: Self-pay | Admitting: Ophthalmology

## 2021-06-30 ENCOUNTER — Other Ambulatory Visit: Payer: Self-pay

## 2021-06-30 ENCOUNTER — Ambulatory Visit (INDEPENDENT_AMBULATORY_CARE_PROVIDER_SITE_OTHER): Payer: Medicare Other | Admitting: Podiatry

## 2021-06-30 DIAGNOSIS — B351 Tinea unguium: Secondary | ICD-10-CM | POA: Diagnosis not present

## 2021-06-30 DIAGNOSIS — M79674 Pain in right toe(s): Secondary | ICD-10-CM | POA: Diagnosis not present

## 2021-06-30 DIAGNOSIS — M79675 Pain in left toe(s): Secondary | ICD-10-CM | POA: Diagnosis not present

## 2021-06-30 NOTE — Progress Notes (Signed)
° °  HPI: 66 y.o. female presenting today for follow-up evaluation of fungal nail infections to bilateral feet.  Patient states that her nails appear significantly better.  She had 6 routine laser treatments.  She is also applied the topical intermittently.  No new complaints at this time  Past Medical History:  Diagnosis Date   Cataract    Mixed form OU   Diabetes mellitus without complication (HCC)    GERD (gastroesophageal reflux disease)    occasional - diet controlled, no meds   Heart murmur    heard by PCP per pt on 03/27/20, PCP never referred pt to see cardiologist   HLD (hyperlipidemia)    Hypertension    Hypertensive retinopathy    OU   Seasonal allergies    Sleep apnea    uses cpap nightly    Past Surgical History:  Procedure Laterality Date   COLONOSCOPY     EYE SURGERY Right 03/28/2020   PPV+MP - Dr. Rennis Chris   GAS INSERTION Right 03/28/2020   Procedure: INSERTION OF GAS;  Surgeon: Rennis Chris, MD;  Location: [facility] OR;  Service: Ophthalmology;  Laterality: Right;   GAS/FLUID EXCHANGE Right 03/28/2020   Procedure: GAS/FLUID EXCHANGE;  Surgeon: Rennis Chris, MD;  Location: [facility] OR;  Service: Ophthalmology;  Laterality: Right;   MEMBRANE PEEL Right 03/28/2020   Procedure: MEMBRANE PEEL;  Surgeon: Rennis Chris, MD;  Location: [facility] OR;  Service: Ophthalmology;  Laterality: Right;   PARS PLANA VITRECTOMY Right 03/28/2020   Procedure: PARS PLANA VITRECTOMY WITH 25 GAUGE;  Surgeon: Rennis Chris, MD;  [facility] OR;  Service: Ophthalmology;  Laterality: Right;   SHOULDER SURGERY Right    remove bone spur   WISDOM TOOTH EXTRACTION      No Known Allergies   Physical Exam: General: The patient is alert and oriented x3 in no acute distress.  Dermatology: Skin is warm, dry and supple bilateral lower extremities. Negative for open lesions or macerations.  Toenails are healthy and supple bilateral.  Vascular: Palpable pedal pulses bilaterally. Capillary refill within  normal limits.  Negative for any significant edema or erythema  Neurological: Light touch and protective threshold grossly intact  Musculoskeletal Exam: No pedal deformities noted   Assessment: 1.  Onychomycosis of toenails bilateral; improved   Plan of Care:  1. Patient evaluated.  2.  Patient may now resume full activity no restrictions.  Recommend good foot hygiene. 3.  Continue OTC topical as needed 4.  Return to clinic as needed      Felecia Shelling, DPM [facility]  Dr. Felecia Shelling, DPM    [address] [place], [place] [address]                Office [phone]  Fax [phone]

## 2021-12-17 NOTE — Progress Notes (Signed)
[facility] - Clinic Note  12/25/2021     CHIEF COMPLAINT Patient presents for Retina Follow Up    HISTORY OF PRESENT ILLNESS: Teresa Fernandez is a 66 y.o. female who presents to the clinic today for:  HPI     Retina Follow Up   Patient presents with  Other.  In both eyes.  Severity is moderate.  Duration of 8 months.  Since onset it is stable.  I, the attending physician,  performed the HPI with the patient and updated documentation appropriately.        Comments   Pt here for 8 mo re t f/u ERM OU. Pt states VA is the same, no changes.       Last edited by Rennis Chris, MD on 12/25/2021  9:31 AM.    Pt has not noticed any change in vision, pt saw Dr. Lorin Picket in April  Referring physician: Fredrich Birks, OD [address] [place] [address]  HISTORICAL INFORMATION:   Selected notes from the MEDICAL RECORD NUMBER Referred by Dr. Fredrich Birks   CURRENT MEDICATIONS: Current Outpatient Medications (Ophthalmic Drugs)  Medication Sig   bacitracin-polymyxin b (POLYSPORIN) ophthalmic ointment Place into the right eye at bedtime. Place a 1/2 inch ribbon of ointment into the lower eyelid at bedtime and as needed (Patient not taking: Reported on 06/18/2020)   prednisoLONE acetate (PRED FORTE) 1 % ophthalmic suspension Place 1 drop into the right eye 6 (six) times daily. (Patient not taking: Reported on 06/18/2020)   No current facility-administered medications for this visit. (Ophthalmic Drugs)   Current Outpatient Medications (Other)  Medication Sig   acetaminophen (TYLENOL) 325 MG tablet Take 325-650 mg by mouth every 6 (six) hours as needed for moderate pain or headache.   amLODipine (NORVASC) 2.5 MG tablet Take 2.5 mg by mouth daily.   atorvastatin (LIPITOR) 20 MG tablet Take 20 mg by mouth daily.   Calcium Carbonate-Vit D-Min (CALCIUM 1200 PO) Take 1 tablet by mouth daily.   cetirizine (ZYRTEC) 10 MG tablet Take 10 mg by mouth daily as needed  for allergies.   cholecalciferol (VITAMIN D3) 25 MCG (1000 UNIT) tablet Take 1,000 Units by mouth daily.   Cinnamon 500 MG capsule Take 500 mg by mouth daily.   co-enzyme Q-10 30 MG capsule Take 30 mg by mouth 3 (three) times daily.   COVID-19 mRNA bivalent vaccine, Pfizer, (PFIZER COVID-19 VAC BIVALENT) injection Inject into the muscle.   ECHINACEA PO Take 2 tablets by mouth daily as needed (immune support).   fluocinonide ointment (LIDEX) 0.05 % Apply 1 application topically daily as needed (eczema).    fluticasone (FLONASE) 50 MCG/ACT nasal spray Place 1 spray into both nostrils daily as needed for allergies or rhinitis.   Homeopathic Products (ZICAM ALLERGY RELIEF NA) Place into the nose.   hydrochlorothiazide (HYDRODIURIL) 25 MG tablet Take 25 mg by mouth daily.    Melatonin 1 MG/4ML LIQD Take by mouth.   Multiple Vitamin (MULTIVITAMIN WITH MINERALS) TABS tablet Take 1 tablet by mouth daily.   Potassium 99 MG TABS Take 99 mg by mouth every other day.   Turmeric 1053 MG TABS Take by mouth.   MAGNESIUM PO Take 1 tablet by mouth daily. With ashwagandha   No current facility-administered medications for this visit. (Other)   REVIEW OF SYSTEMS: ROS   Positive for: Gastrointestinal, Cardiovascular, Eyes, Respiratory Negative for: Constitutional, Neurological, Skin, Genitourinary, Musculoskeletal, HENT, Endocrine, Psychiatric, Allergic/Imm, Heme/Lymph Last edited by Lodema Hong,  Makenzie E, COT on 12/25/2021  9:00 AM.     ALLERGIES No Known Allergies  PAST MEDICAL HISTORY Past Medical History:  Diagnosis Date   Cataract    Mixed form OU   Diabetes mellitus without complication (HCC)    GERD (gastroesophageal reflux disease)    occasional - diet controlled, no meds   Heart murmur    heard by PCP per pt on 03/27/20, PCP never referred pt to see cardiologist   HLD (hyperlipidemia)    Hypertension    Hypertensive retinopathy    OU   Seasonal allergies    Sleep apnea    uses cpap  nightly   Past Surgical History:  Procedure Laterality Date   COLONOSCOPY     EYE SURGERY Right 03/28/2020   PPV+MP - Dr. Rennis Chris   GAS INSERTION Right 03/28/2020   Procedure: INSERTION OF GAS;  Surgeon: Rennis Chris, MD;  Location: [facility] OR;  Service: Ophthalmology;  Laterality: Right;   GAS/FLUID EXCHANGE Right 03/28/2020   Procedure: GAS/FLUID EXCHANGE;  Surgeon: Rennis Chris, MD;  Location: [facility] OR;  Service: Ophthalmology;  Laterality: Right;   MEMBRANE PEEL Right 03/28/2020   Procedure: MEMBRANE PEEL;  Surgeon: Rennis Chris, MD;  Location: [facility] OR;  Service: Ophthalmology;  Laterality: Right;   PARS PLANA VITRECTOMY Right 03/28/2020   Procedure: PARS PLANA VITRECTOMY WITH 25 GAUGE;  Surgeon: Rennis Chris, MD;  Location: [facility] OR;  Service: Ophthalmology;  Laterality: Right;   SHOULDER SURGERY Right    remove bone spur   WISDOM TOOTH EXTRACTION     FAMILY HISTORY Family History  Problem Relation Age of Onset   Breast cancer Neg Hx    SOCIAL HISTORY Social History   Tobacco Use   Smoking status: Former    Years: 5.00    Types: Cigarettes   Smokeless tobacco: Never   Tobacco comments:    smoked from age [age] yrs old   Vaping Use   Vaping Use: Never used  Substance Use Topics   Alcohol use: Never   Drug use: Never       OPHTHALMIC EXAM:  Base Eye Exam     Visual Acuity (Snellen - Linear)       Right Left   Dist cc 20/20 20/20    Correction: Glasses         Tonometry (Tonopen, 9:04 AM)       Right Left   Pressure 15 12         Pupils       Dark Light Shape React APD   Right 4 3 Round Brisk None   Left 4 3 Round Brisk None         Visual Fields (Counting fingers)       Left Right    Full Full         Extraocular Movement       Right Left    Full, Ortho Full, Ortho         Neuro/Psych     Oriented x3: Yes   Mood/Affect: Normal         Dilation     Both eyes: 1.0% Mydriacyl, 2.5% Phenylephrine @ 9:05 AM            Slit Lamp and Fundus Exam     Slit Lamp Exam       Right Left   Lids/Lashes Normal Normal   Conjunctiva/Sclera mild melanosis Mild Melanosis   Cornea Trace Punctate epithelial erosions, mild arcus, trace endopigment,  well healed cataract wound Trace Punctate epithelial erosions, arcus, faint, fine endo pigment   Anterior Chamber deep, clear, narrow temporal angle deep and clear, narrow angles   Iris Round and dilated Round and dilated, mild anterior bowing   Lens PC IOL in good position 2-3+ Nuclear sclerosis, 2-3+ Cortical cataract   Anterior Vitreous post vitrectomy Vitreous syneresis         Fundus Exam       Right Left   Disc trace pallor, Sharp rim, mild PPA/PPP Pink and Sharp, trace fibrosis, trace PPP   C/D Ratio 0.5 0.4   Macula Flat, Good foveal reflex, ERM and pucker gone, No heme or edema good foveal reflex, ERM with striae superior and temporal macula, No heme or edema   Vessels attenuated, copper wiring, Tortuous attenuated, Tortuous, mild Copper wiring   Periphery Attached, mild pigmented cystoid degeneration Attached, mild pigmented cystoid degeneration           Refraction     Wearing Rx       Sphere Cylinder Axis Add   Right -1.75 +1.25 113 +2.50   Left -3.75 +0.50 055 +2.50    Type: PROG           IMAGING AND PROCEDURES  Imaging and Procedures for 12/25/2021  OCT, Retina - OU - Both Eyes       Right Eye Quality was good. Central Foveal Thickness: 351. Progression has been stable. Findings include normal foveal contour, no IRF, no SRF (ERM gone, pucker improved, mild inner retinal surface irregularity -- stable from prior).   Left Eye Quality was good. Central Foveal Thickness: 269. Progression has been stable. Findings include normal foveal contour, no IRF, no SRF, epiretinal membrane, macular pucker (Mild ERM with stable thickening of temporal fovea; foveal contour remains good, stable pucker, partial PVD).   Notes *Images captured and  stored on drive  Diagnosis / Impression:  OD: NFP; no IRF/SRF; ERM gone, pucker improved OS: Mild ERM with stable thickening of temporal fovea; foveal contour remains good, stable pucker, partial PVD  Clinical management:  See below  Abbreviations: NFP - Normal foveal profile. CME - cystoid macular edema. PED - pigment epithelial detachment. IRF - intraretinal fluid. SRF - subretinal fluid. EZ - ellipsoid zone. ERM - epiretinal membrane. ORA - outer retinal atrophy. ORT - outer retinal tubulation. SRHM - subretinal hyper-reflective material. IRHM - intraretinal hyper-reflective material            ASSESSMENT/PLAN:    ICD-10-CM   1. Epiretinal membrane (ERM) of both eyes  H35.373 OCT, Retina - OU - Both Eyes    2. Essential hypertension  I10     3. Hypertensive retinopathy of both eyes  H35.033     4. Combined forms of age-related cataract of left eye  H25.812     5. Pseudophakia  Z96.1      1. Epiretinal membrane, OU (OD>OS)  - pre-op BCVA OD: 20/50; OS: 20/20 (MRx) - pt reported generalized blur, mild metamorphopsia OD - OS stable --Mild ERM with stable thickening of temporal fovea; foveal contour remains good, stable pucker, partial PVD -- VA remains 20/20 OS  - s/p PPV/TissueBlue/MP/20% SF6 OD, 11.18.21 -- stable improvement in ERM             - doing well -- BCVA OD 20/20 post cataract surgery              - IOP 15 - f/u 9-12 months -- DFE, OCT  2,3. Hypertensive  retinopathy OU - discussed importance of tight BP control - monitor   4. Mixed Cataract OS - The symptoms of cataract, surgical options, and treatments and risks were discussed with patient. - discussed diagnosis and progression - monitor   5. Pseudophakia OD  - s/p CE/IOL OD (Dr. Alben Spittle, 7.6.22)  - IOL in good position, doing well  - monitor   Ophthalmic Meds Ordered this visit:  No orders of the defined types were placed in this encounter.     Return for f/u 9-12 months, ERM OU, DFE,  OCT.  There are no Patient Instructions on file for this visit.  This document serves as a record of services personally performed by Karie Chimera, MD, PhD. It was created on their behalf by Glee Arvin. Manson Passey, OA an ophthalmic technician. The creation of this record is the provider's dictation and/or activities during the visit.    Electronically signed by: Glee Arvin. Manson Passey, [place] 08.09.2023 9:33 AM  Karie Chimera, M.D., Ph.D. Diseases & Surgery of the Retina and Vitreous [facility]  I have reviewed the above documentation for accuracy and completeness, and I agree with the above. Karie Chimera, M.D., Ph.D. 12/25/21 9:33 AM  Abbreviations: M myopia (nearsighted); A astigmatism; H hyperopia (farsighted); P presbyopia; Mrx spectacle prescription;  CTL contact lenses; OD right eye; OS left eye; OU both eyes  XT exotropia; ET esotropia; PEK punctate epithelial keratitis; PEE punctate epithelial erosions; DES dry eye syndrome; MGD meibomian gland dysfunction; ATs artificial tears; PFAT's preservative free artificial tears; NSC nuclear sclerotic cataract; PSC posterior subcapsular cataract; ERM epi-retinal membrane; PVD posterior vitreous detachment; RD retinal detachment; DM diabetes mellitus; DR diabetic retinopathy; NPDR non-proliferative diabetic retinopathy; PDR proliferative diabetic retinopathy; CSME clinically significant macular edema; DME diabetic macular edema; dbh dot blot hemorrhages; CWS cotton wool spot; POAG primary open angle glaucoma; C/D cup-to-disc ratio; HVF humphrey visual field; GVF goldmann visual field; OCT optical coherence tomography; IOP intraocular pressure; BRVO Branch retinal vein occlusion; CRVO central retinal vein occlusion; CRAO central retinal artery occlusion; BRAO branch retinal artery occlusion; RT retinal tear; SB scleral buckle; PPV pars plana vitrectomy; VH Vitreous hemorrhage; PRP panretinal laser photocoagulation; IVK intravitreal kenalog;  VMT vitreomacular traction; MH Macular hole;  NVD neovascularization of the disc; NVE neovascularization elsewhere; AREDS age related eye disease study; ARMD age related macular degeneration; POAG primary open angle glaucoma; EBMD epithelial/anterior basement membrane dystrophy; ACIOL anterior chamber intraocular lens; IOL intraocular lens; PCIOL posterior chamber intraocular lens; Phaco/IOL phacoemulsification with intraocular lens placement; PRK photorefractive keratectomy; LASIK laser assisted in situ keratomileusis; HTN hypertension; DM diabetes mellitus; COPD chronic obstructive pulmonary disease

## 2021-12-18 ENCOUNTER — Encounter (INDEPENDENT_AMBULATORY_CARE_PROVIDER_SITE_OTHER): Payer: Medicare Other | Admitting: Ophthalmology

## 2021-12-18 DIAGNOSIS — H35373 Puckering of macula, bilateral: Secondary | ICD-10-CM

## 2021-12-18 DIAGNOSIS — H25812 Combined forms of age-related cataract, left eye: Secondary | ICD-10-CM

## 2021-12-18 DIAGNOSIS — I10 Essential (primary) hypertension: Secondary | ICD-10-CM

## 2021-12-18 DIAGNOSIS — Z961 Presence of intraocular lens: Secondary | ICD-10-CM

## 2021-12-18 DIAGNOSIS — H35033 Hypertensive retinopathy, bilateral: Secondary | ICD-10-CM

## 2021-12-25 ENCOUNTER — Encounter (INDEPENDENT_AMBULATORY_CARE_PROVIDER_SITE_OTHER): Payer: Self-pay | Admitting: Ophthalmology

## 2021-12-25 ENCOUNTER — Ambulatory Visit (INDEPENDENT_AMBULATORY_CARE_PROVIDER_SITE_OTHER): Payer: Medicare Other | Admitting: Ophthalmology

## 2021-12-25 DIAGNOSIS — H25812 Combined forms of age-related cataract, left eye: Secondary | ICD-10-CM

## 2021-12-25 DIAGNOSIS — I10 Essential (primary) hypertension: Secondary | ICD-10-CM | POA: Diagnosis not present

## 2021-12-25 DIAGNOSIS — H35033 Hypertensive retinopathy, bilateral: Secondary | ICD-10-CM | POA: Diagnosis not present

## 2021-12-25 DIAGNOSIS — H35373 Puckering of macula, bilateral: Secondary | ICD-10-CM | POA: Diagnosis not present

## 2021-12-25 DIAGNOSIS — Z961 Presence of intraocular lens: Secondary | ICD-10-CM

## 2022-02-10 ENCOUNTER — Other Ambulatory Visit: Payer: Self-pay | Admitting: Family Medicine

## 2022-02-10 DIAGNOSIS — Z1231 Encounter for screening mammogram for malignant neoplasm of breast: Secondary | ICD-10-CM

## 2022-03-17 ENCOUNTER — Ambulatory Visit
Admission: RE | Admit: 2022-03-17 | Discharge: 2022-03-17 | Disposition: A | Discharge status: Home / Self Care | Payer: Medicare Other | Source: Ambulatory Visit | Attending: Family Medicine | Admitting: Family Medicine

## 2022-03-17 DIAGNOSIS — Z1231 Encounter for screening mammogram for malignant neoplasm of breast: Secondary | ICD-10-CM

## 2022-03-19 ENCOUNTER — Other Ambulatory Visit: Payer: Self-pay | Admitting: Family Medicine

## 2022-03-19 DIAGNOSIS — R928 Other abnormal and inconclusive findings on diagnostic imaging of breast: Secondary | ICD-10-CM

## 2022-03-31 ENCOUNTER — Ambulatory Visit
Admission: RE | Admit: 2022-03-31 | Discharge: 2022-03-31 | Disposition: A | Discharge status: Home / Self Care | Payer: Medicare Other | Source: Ambulatory Visit | Attending: Family Medicine | Admitting: Family Medicine

## 2022-03-31 DIAGNOSIS — R928 Other abnormal and inconclusive findings on diagnostic imaging of breast: Secondary | ICD-10-CM

## 2022-07-01 ENCOUNTER — Other Ambulatory Visit: Payer: Self-pay | Admitting: Family Medicine

## 2022-07-01 DIAGNOSIS — E2839 Other primary ovarian failure: Secondary | ICD-10-CM

## 2022-11-18 NOTE — Progress Notes (Signed)
[facility] - Clinic Note  11/26/2022     CHIEF COMPLAINT Patient presents for Retina Follow Up    HISTORY OF PRESENT ILLNESS: Teresa Fernandez is a 67 y.o. female who presents to the clinic today for:  HPI     Retina Follow Up   Patient presents with  Other.  In both eyes.  This started years ago.  Severity is moderate.  Duration of 11 months.  Since onset it is stable.  I, the attending physician,  performed the HPI with the patient and updated documentation appropriately.        Comments   Patient feels that the vision is the same. She is not using eye drops at this time. She does not check her blood sugar.      Last edited by Rennis Chris, MD on 11/26/2022  7:30 PM.     Pt states vision is the same  Referring physician: Fredrich Birks, OD [address] [place] [address]  HISTORICAL INFORMATION:   Selected notes from the MEDICAL RECORD NUMBER Referred by Dr. Fredrich Birks   CURRENT MEDICATIONS: Current Outpatient Medications (Ophthalmic Drugs)  Medication Sig   bacitracin-polymyxin b (POLYSPORIN) ophthalmic ointment Place into the right eye at bedtime. Place a 1/2 inch ribbon of ointment into the lower eyelid at bedtime and as needed (Patient not taking: Reported on 06/18/2020)   prednisoLONE acetate (PRED FORTE) 1 % ophthalmic suspension Place 1 drop into the right eye 6 (six) times daily. (Patient not taking: Reported on 06/18/2020)   No current facility-administered medications for this visit. (Ophthalmic Drugs)   Current Outpatient Medications (Other)  Medication Sig   acetaminophen (TYLENOL) 325 MG tablet Take 325-650 mg by mouth every 6 (six) hours as needed for moderate pain or headache.   amLODipine (NORVASC) 2.5 MG tablet Take 2.5 mg by mouth daily.   atorvastatin (LIPITOR) 20 MG tablet Take 20 mg by mouth daily.   Calcium Carbonate-Vit D-Min (CALCIUM 1200 PO) Take 1 tablet by mouth daily.   cetirizine (ZYRTEC) 10 MG tablet  Take 10 mg by mouth daily as needed for allergies.   cholecalciferol (VITAMIN D3) 25 MCG (1000 UNIT) tablet Take 1,000 Units by mouth daily.   Cinnamon 500 MG capsule Take 500 mg by mouth daily.   co-enzyme Q-10 30 MG capsule Take 30 mg by mouth 3 (three) times daily.   COVID-19 mRNA bivalent vaccine, Pfizer, (PFIZER COVID-19 VAC BIVALENT) injection Inject into the muscle.   ECHINACEA PO Take 2 tablets by mouth daily as needed (immune support).   fluocinonide ointment (LIDEX) 0.05 % Apply 1 application topically daily as needed (eczema).    fluticasone (FLONASE) 50 MCG/ACT nasal spray Place 1 spray into both nostrils daily as needed for allergies or rhinitis.   Homeopathic Products (ZICAM ALLERGY RELIEF NA) Place into the nose.   hydrochlorothiazide (HYDRODIURIL) 25 MG tablet Take 25 mg by mouth daily.    MAGNESIUM PO Take 1 tablet by mouth daily. With ashwagandha   Melatonin 1 MG/4ML LIQD Take by mouth.   Multiple Vitamin (MULTIVITAMIN WITH MINERALS) TABS tablet Take 1 tablet by mouth daily.   Potassium 99 MG TABS Take 99 mg by mouth every other day.   Turmeric 1053 MG TABS Take by mouth.   No current facility-administered medications for this visit. (Other)   REVIEW OF SYSTEMS:   ALLERGIES No Known Allergies  PAST MEDICAL HISTORY Past Medical History:  Diagnosis Date   Cataract  Mixed form OU   Diabetes mellitus without complication (HCC)    GERD (gastroesophageal reflux disease)    occasional - diet controlled, no meds   Heart murmur    heard by PCP per pt on 03/27/20, PCP never referred pt to see cardiologist   HLD (hyperlipidemia)    Hypertension    Hypertensive retinopathy    OU   Seasonal allergies    Sleep apnea    uses cpap nightly   Past Surgical History:  Procedure Laterality Date   COLONOSCOPY     EYE SURGERY Right 03/28/2020   PPV+MP - Dr. Rennis Chris   GAS INSERTION Right 03/28/2020   Procedure: INSERTION OF GAS;  Surgeon: Rennis Chris, MD;  Location:  [facility] OR;  Service: Ophthalmology;  Laterality: Right;   GAS/FLUID EXCHANGE Right 03/28/2020   Procedure: GAS/FLUID EXCHANGE;  Surgeon: Rennis Chris, MD;  Location: [facility] OR;  Service: Ophthalmology;  Laterality: Right;   MEMBRANE PEEL Right 03/28/2020   Procedure: MEMBRANE PEEL;  Surgeon: Rennis Chris, MD;  Location: [facility] OR;  Service: Ophthalmology;  Laterality: Right;   PARS PLANA VITRECTOMY Right 03/28/2020   Procedure: PARS PLANA VITRECTOMY WITH 25 GAUGE;  Surgeon: Rennis Chris, MD;  Location: [facility] OR;  Service: Ophthalmology;  Laterality: Right;   SHOULDER SURGERY Right    remove bone spur   WISDOM TOOTH EXTRACTION     FAMILY HISTORY Family History  Problem Relation Age of Onset   Breast cancer Neg Hx    SOCIAL HISTORY Social History   Tobacco Use   Smoking status: Former    Types: Cigarettes   Smokeless tobacco: Never   Tobacco comments:    smoked from age [age] yrs old   Vaping Use   Vaping status: Never Used  Substance Use Topics   Alcohol use: Never   Drug use: Never       OPHTHALMIC EXAM:  Base Eye Exam     Visual Acuity (Snellen - Linear)       Right Left   Dist cc 20/20 20/20    Correction: Glasses         Tonometry (Tonopen, 9:17 AM)       Right Left   Pressure 14 13         Pupils       Dark Light Shape React APD   Right 4 3 Round Brisk None   Left 4 3 Round Brisk None         Visual Fields       Left Right    Full Full         Extraocular Movement       Right Left    Full, Ortho Full, Ortho         Neuro/Psych     Oriented x3: Yes   Mood/Affect: Normal         Dilation     Both eyes: 1.0% Mydriacyl, 2.5% Phenylephrine @ 9:14 AM           Slit Lamp and Fundus Exam     Slit Lamp Exam       Right Left   Lids/Lashes Normal Normal   Conjunctiva/Sclera mild melanosis Mild Melanosis   Cornea Trace Punctate epithelial erosions, mild arcus, trace endopigment, well healed cataract wound Trace Punctate epithelial  erosions, arcus   Anterior Chamber deep, clear, narrow temporal angle deep and clear, narrow angles   Iris Round and dilated Round and dilated, mild anterior bowing   Lens PC  IOL in good position 2-3+ Nuclear sclerosis, 2-3+ Cortical cataract   Anterior Vitreous post vitrectomy mild syneresis, low lying incomplete Posterior vitreous detachment         Fundus Exam       Right Left   Disc trace pallor, Sharp rim, mild PPA/PPP Pink and Sharp, trace fibrosis, trace PPP   C/D Ratio 0.5 0.5   Macula Flat, Good foveal reflex, ERM and pucker gone, No heme or edema good foveal reflex, ERM with striae temporal macula, No heme or edema   Vessels mild tortuosity, mild copper wiring attenuated, Tortuous   Periphery Attached, mild pigmented cystoid degeneration, No heme Attached, mild pigmented cystoid degeneration, No heme           Refraction     Wearing Rx       Sphere Cylinder Axis Add   Right -1.75 +1.25 113 +2.50   Left -3.75 +0.50 055 +2.50    Type: PROG           IMAGING AND PROCEDURES  Imaging and Procedures for 11/26/2022  OCT, Retina - OU - Both Eyes       Right Eye Quality was good. Central Foveal Thickness: 344. Progression has been stable. Findings include normal foveal contour, no IRF, no SRF (ERM gone, pucker improved, mild inner retinal surface irregularity -- stable from prior).   Left Eye Quality was good. Central Foveal Thickness: 281. Progression has been stable. Findings include normal foveal contour, no IRF, no SRF, epiretinal membrane, macular pucker (Mild ERM with stable thickening of temporal fovea; foveal contour remains good, persistent pucker -- slightly increased, partial PVD).   Notes *Images captured and stored on drive  Diagnosis / Impression:  OD: NFP; no IRF/SRF; ERM gone, pucker improved OS: Mild ERM with stable thickening of temporal fovea; foveal contour remains good, persistent pucker -- slightly increased, partial PVD  Clinical  management:  See below  Abbreviations: NFP - Normal foveal profile. CME - cystoid macular edema. PED - pigment epithelial detachment. IRF - intraretinal fluid. SRF - subretinal fluid. EZ - ellipsoid zone. ERM - epiretinal membrane. ORA - outer retinal atrophy. ORT - outer retinal tubulation. SRHM - subretinal hyper-reflective material. IRHM - intraretinal hyper-reflective material            ASSESSMENT/PLAN:    ICD-10-CM   1. Epiretinal membrane (ERM) of both eyes  H35.373 OCT, Retina - OU - Both Eyes    2. Essential hypertension  I10     3. Hypertensive retinopathy of both eyes  H35.033     4. Combined forms of age-related cataract of left eye  H25.812     5. Pseudophakia  Z96.1      1. Epiretinal membrane, OU (OD>OS)  - pre-op BCVA OD: 20/50; OS: 20/20 (MRx) - pt reported generalized blur, mild metamorphopsia OD - OS stable --Mild ERM with stable thickening of temporal fovea; foveal contour remains good, persistent pucker--slightly increased, partial PVD -- VA remains 20/20 OS  - s/p PPV/TissueBlue/MP/20% SF6 OD, 11.18.21 -- stable improvement in ERM             - doing well -- BCVA OD 20/20 post cataract surgery              - IOP 15 - f/u 1 yr -- DFE, OCT  2,3. Hypertensive retinopathy OU - discussed importance of tight BP control - monitor   4. Mixed Cataract OS - The symptoms of cataract, surgical options, and treatments and risks were discussed  with patient. - discussed diagnosis and progression - monitor   5. Pseudophakia OD  - s/p CE/IOL OD (Dr. Alben Spittle, 7.6.22)  - IOL in good position, doing well  - monitor   Ophthalmic Meds Ordered this visit:  No orders of the defined types were placed in this encounter.    Return in about 1 year (around 11/26/2023) for f/u ERM OU, DFE, OCT.  There are no Patient Instructions on file for this visit.  This document serves as a record of services personally performed by Karie Chimera, MD, PhD. It was created on their  behalf by Glee Arvin. Manson Passey, OA an ophthalmic technician. The creation of this record is the provider's dictation and/or activities during the visit.    Electronically signed by: Glee Arvin. Manson Passey, OA 11/26/22 7:36 PM   Karie Chimera, M.D., Ph.D. Diseases & Surgery of the Retina and Vitreous [facility]  I have reviewed the above documentation for accuracy and completeness, and I agree with the above. Karie Chimera, M.D., Ph.D. 11/26/22 7:38 PM   Abbreviations: M myopia (nearsighted); A astigmatism; H hyperopia (farsighted); P presbyopia; Mrx spectacle prescription;  CTL contact lenses; OD right eye; OS left eye; OU both eyes  XT exotropia; ET esotropia; PEK punctate epithelial keratitis; PEE punctate epithelial erosions; DES dry eye syndrome; MGD meibomian gland dysfunction; ATs artificial tears; PFAT's preservative free artificial tears; NSC nuclear sclerotic cataract; PSC posterior subcapsular cataract; ERM epi-retinal membrane; PVD posterior vitreous detachment; RD retinal detachment; DM diabetes mellitus; DR diabetic retinopathy; NPDR non-proliferative diabetic retinopathy; PDR proliferative diabetic retinopathy; CSME clinically significant macular edema; DME diabetic macular edema; dbh dot blot hemorrhages; CWS cotton wool spot; POAG primary open angle glaucoma; C/D cup-to-disc ratio; HVF humphrey visual field; GVF goldmann visual field; OCT optical coherence tomography; IOP intraocular pressure; BRVO Branch retinal vein occlusion; CRVO central retinal vein occlusion; CRAO central retinal artery occlusion; BRAO branch retinal artery occlusion; RT retinal tear; SB scleral buckle; PPV pars plana vitrectomy; VH Vitreous hemorrhage; PRP panretinal laser photocoagulation; IVK intravitreal kenalog; VMT vitreomacular traction; MH Macular hole;  NVD neovascularization of the disc; NVE neovascularization elsewhere; AREDS age related eye disease study; ARMD age related macular  degeneration; POAG primary open angle glaucoma; EBMD epithelial/anterior basement membrane dystrophy; ACIOL anterior chamber intraocular lens; IOL intraocular lens; PCIOL posterior chamber intraocular lens; Phaco/IOL phacoemulsification with intraocular lens placement; PRK photorefractive keratectomy; LASIK laser assisted in situ keratomileusis; HTN hypertension; DM diabetes mellitus; COPD chronic obstructive pulmonary disease

## 2022-11-19 ENCOUNTER — Encounter (INDEPENDENT_AMBULATORY_CARE_PROVIDER_SITE_OTHER): Payer: Medicare Other | Admitting: Ophthalmology

## 2022-11-26 ENCOUNTER — Encounter (INDEPENDENT_AMBULATORY_CARE_PROVIDER_SITE_OTHER): Payer: Self-pay | Admitting: Ophthalmology

## 2022-11-26 ENCOUNTER — Ambulatory Visit (INDEPENDENT_AMBULATORY_CARE_PROVIDER_SITE_OTHER): Payer: Medicare Other | Admitting: Ophthalmology

## 2022-11-26 DIAGNOSIS — H25812 Combined forms of age-related cataract, left eye: Secondary | ICD-10-CM | POA: Diagnosis not present

## 2022-11-26 DIAGNOSIS — H35373 Puckering of macula, bilateral: Secondary | ICD-10-CM

## 2022-11-26 DIAGNOSIS — I10 Essential (primary) hypertension: Secondary | ICD-10-CM

## 2022-11-26 DIAGNOSIS — Z961 Presence of intraocular lens: Secondary | ICD-10-CM

## 2022-11-26 DIAGNOSIS — H35033 Hypertensive retinopathy, bilateral: Secondary | ICD-10-CM

## 2023-02-02 ENCOUNTER — Ambulatory Visit
Admission: RE | Admit: 2023-02-02 | Discharge: 2023-02-02 | Disposition: A | Discharge status: Home / Self Care | Payer: Medicare Other | Source: Ambulatory Visit | Attending: Family Medicine | Admitting: Family Medicine

## 2023-02-02 DIAGNOSIS — E2839 Other primary ovarian failure: Secondary | ICD-10-CM

## 2023-02-16 ENCOUNTER — Other Ambulatory Visit: Payer: Self-pay | Admitting: Family Medicine

## 2023-02-16 DIAGNOSIS — Z1231 Encounter for screening mammogram for malignant neoplasm of breast: Secondary | ICD-10-CM

## 2023-03-23 ENCOUNTER — Ambulatory Visit
Admission: RE | Admit: 2023-03-23 | Discharge: 2023-03-23 | Disposition: A | Discharge status: Home / Self Care | Payer: Medicare Other | Source: Ambulatory Visit | Attending: Family Medicine | Admitting: Family Medicine

## 2023-03-23 DIAGNOSIS — Z1231 Encounter for screening mammogram for malignant neoplasm of breast: Secondary | ICD-10-CM

## 2023-11-23 NOTE — Progress Notes (Signed)
 [facility] - Clinic Note  11/26/2023     CHIEF COMPLAINT Patient presents for Retina Follow Up    HISTORY OF PRESENT ILLNESS: Teresa Fernandez is a 68 y.o. female who presents to the clinic today for:  HPI     Retina Follow Up   In both eyes.  This started 1 year ago.  Duration of 1 year.  Since onset it is stable.  I, the attending physician,  performed the HPI with the patient and updated documentation appropriately.        Comments   1 year retina follow up ERM OU pt is reporting no vision changes noticed she denies any flashes has had some floaters       Last edited by Valdemar Rogue, MD on 11/28/2023  3:59 PM.    Pt states vision is the same  Referring physician: Thom Hamilton, OD [address] [place] [address]  HISTORICAL INFORMATION:   Selected notes from the MEDICAL RECORD NUMBER Referred by Dr. Thom Hamilton   CURRENT MEDICATIONS: Current Outpatient Medications (Ophthalmic Drugs)  Medication Sig   bacitracin -polymyxin b  (POLYSPORIN ) ophthalmic ointment Place into the right eye at bedtime. Place a 1/2 inch ribbon of ointment into the lower eyelid at bedtime and as needed (Patient not taking: Reported on 06/18/2020)   prednisoLONE  acetate (PRED FORTE ) 1 % ophthalmic suspension Place 1 drop into the right eye 6 (six) times daily. (Patient not taking: Reported on 06/18/2020)   No current facility-administered medications for this visit. (Ophthalmic Drugs)   Current Outpatient Medications (Other)  Medication Sig   acetaminophen  (TYLENOL ) 325 MG tablet Take 325-650 mg by mouth every 6 (six) hours as needed for moderate pain or headache.   amLODipine (NORVASC) 2.5 MG tablet Take 2.5 mg by mouth daily.   atorvastatin (LIPITOR) 20 MG tablet Take 20 mg by mouth daily.   Calcium Carbonate-Vit D-Min (CALCIUM 1200 PO) Take 1 tablet by mouth daily.   cetirizine (ZYRTEC) 10 MG tablet Take 10 mg by mouth daily as needed for allergies.    cholecalciferol (VITAMIN D3) 25 MCG (1000 UNIT) tablet Take 1,000 Units by mouth daily.   Cinnamon 500 MG capsule Take 500 mg by mouth daily.   co-enzyme Q-10 30 MG capsule Take 30 mg by mouth 3 (three) times daily.   COVID-19 mRNA bivalent vaccine, Pfizer, (PFIZER COVID-19 VAC BIVALENT) injection Inject into the muscle.   ECHINACEA PO Take 2 tablets by mouth daily as needed (immune support).   fluocinonide ointment (LIDEX) 0.05 % Apply 1 application topically daily as needed (eczema).    fluticasone (FLONASE) 50 MCG/ACT nasal spray Place 1 spray into both nostrils daily as needed for allergies or rhinitis.   Homeopathic Products (ZICAM ALLERGY RELIEF NA) Place into the nose.   hydrochlorothiazide (HYDRODIURIL) 25 MG tablet Take 25 mg by mouth daily.    Melatonin 1 MG/4ML LIQD Take by mouth.   Multiple Vitamin (MULTIVITAMIN WITH MINERALS) TABS tablet Take 1 tablet by mouth daily.   Potassium 99 MG TABS Take 99 mg by mouth every other day.   MAGNESIUM PO Take 1 tablet by mouth daily. With ashwagandha   Turmeric 1053 MG TABS Take by mouth.   No current facility-administered medications for this visit. (Other)   REVIEW OF SYSTEMS: ROS   Positive for: Gastrointestinal, Cardiovascular, Eyes, Respiratory Negative for: Constitutional, Neurological, Skin, Genitourinary, Musculoskeletal, HENT, Endocrine, Psychiatric, Allergic/Imm, Heme/Lymph Last edited by Resa Delon ORN, COT on 11/26/2023  8:30 AM.  ALLERGIES No Known Allergies  PAST MEDICAL HISTORY Past Medical History:  Diagnosis Date   Cataract    Mixed form OU   Diabetes mellitus without complication (HCC)    GERD (gastroesophageal reflux disease)    occasional - diet controlled, no meds   Heart murmur    heard by PCP per pt on 03/27/20, PCP never referred pt to see cardiologist   HLD (hyperlipidemia)    Hypertension    Hypertensive retinopathy    OU   Seasonal allergies    Sleep apnea    uses cpap nightly   Past  Surgical History:  Procedure Laterality Date   COLONOSCOPY     EYE SURGERY Right 03/28/2020   PPV+MP - Dr. Redell Hans   GAS INSERTION Right 03/28/2020   Procedure: INSERTION OF GAS;  Surgeon: Hans Redell, MD;  Location: [facility] OR;  Service: Ophthalmology;  Laterality: Right;   GAS/FLUID EXCHANGE Right 03/28/2020   Procedure: GAS/FLUID EXCHANGE;  Surgeon: Hans Redell, MD;  Location[facility];  Service: Ophthalmology;  Laterality: Right;   MEMBRANE PEEL Right 03/28/2020   Procedure: MEMBRANE PEEL;  Surgeon: Hans Redell, MD;  Location: [facility] OR;  Service: Ophthalmology;  Laterality: Right;   PARS PLANA VITRECTOMY Right 03/28/2020   Procedure: PARS PLANA VITRECTOMY WITH 25 GAUGE;  Surgeon: Hans Redell, MD;  Location: [facility] OR;  Service: Ophthalmology;  Laterality: Right;   SHOULDER SURGERY Right    remove bone spur   WISDOM TOOTH EXTRACTION     FAMILY HISTORY Family History  Problem Relation Age of Onset   Breast cancer Neg Hx    SOCIAL HISTORY Social History   Tobacco Use   Smoking status: Former    Types: Cigarettes   Smokeless tobacco: Never   Tobacco comments:    smoked from age [age] yrs old   Vaping Use   Vaping status: Never Used  Substance Use Topics   Alcohol use: Never   Drug use: Never       OPHTHALMIC EXAM:  Base Eye Exam     Visual Acuity (Snellen - Linear)       Right Left   Dist cc 20/20 -1 20/20 -2         Tonometry (Tonopen, 8:35 AM)       Right Left   Pressure 15 16         Pupils       Pupils Dark Light Shape React APD   Right PERRL 4 3 Round Brisk None   Left PERRL 4 3 Round Brisk None         Visual Fields       Left Right    Full Full         Extraocular Movement       Right Left    Full, Ortho Full, Ortho         Neuro/Psych     Oriented x3: Yes   Mood/Affect: Normal         Dilation     Both eyes: 2.5% Phenylephrine  @ 8:35 AM           Slit Lamp and Fundus Exam     Slit Lamp Exam       Right Left    Lids/Lashes Normal Normal   Conjunctiva/Sclera mild melanosis Mild Melanosis   Cornea Trace Punctate epithelial erosions, mild arcus, trace endo pigment, well healed cataract wound arcus, trace tear film debris   Anterior Chamber deep, clear, narrow temporal angle deep and  clear, narrow angles   Iris Round and dilated Round and dilated, mild anterior bowing   Lens PC IOL in good position 2-3+ Nuclear sclerosis, 2-3+ Cortical cataract   Anterior Vitreous post vitrectomy mild syneresis, low lying incomplete Posterior vitreous detachment         Fundus Exam       Right Left   Disc trace pallor, Sharp rim, mild PPA/PPP Pink and Sharp, trace fibrosis, trace PPP   C/D Ratio 0.5 0.5   Macula Flat, Good foveal reflex, ERM and pucker gone, No heme or edema good foveal reflex, ERM with striae temporal macula, No heme or edema   Vessels mild tortuosity, mild copper wiring attenuated, mild tortuosity   Periphery Attached, mild pigmented cystoid degeneration, No heme Attached, mild pigmented cystoid degeneration, No heme           Refraction     Wearing Rx       Sphere Cylinder Axis Add   Right -1.75 +1.25 113 +2.50   Left -3.75 +0.50 055 +2.50    Type: PROG           IMAGING AND PROCEDURES  Imaging and Procedures for 11/26/2023  OCT, Retina - OU - Both Eyes       Right Eye Quality was good. Central Foveal Thickness: 338. Progression has been stable. Findings include normal foveal contour, no IRF, no SRF (ERM gone, pucker improved, mild inner retinal surface irregularity -- stable from prior).   Left Eye Quality was good. Central Foveal Thickness: 287. Progression has been stable. Findings include normal foveal contour, no IRF, no SRF, epiretinal membrane, macular pucker (Mild ERM with stable thickening of temporal fovea; foveal contour remains good, persistent pucker -- slightly increased, partial PVD).   Notes *Images captured and stored on drive  Diagnosis / Impression:   OD: NFP; no IRF/SRF; ERM gone, pucker improved OS: Mild ERM with stable thickening of temporal fovea; foveal contour remains good, persistent pucker -- slightly increased, partial PVD  Clinical management:  See below  Abbreviations: NFP - Normal foveal profile. CME - cystoid macular edema. PED - pigment epithelial detachment. IRF - intraretinal fluid. SRF - subretinal fluid. EZ - ellipsoid zone. ERM - epiretinal membrane. ORA - outer retinal atrophy. ORT - outer retinal tubulation. SRHM - subretinal hyper-reflective material. IRHM - intraretinal hyper-reflective material             ASSESSMENT/PLAN:    ICD-10-CM   1. Epiretinal membrane (ERM) of both eyes  H35.373 OCT, Retina - OU - Both Eyes    2. Essential hypertension  I10     3. Hypertensive retinopathy of both eyes  H35.033     4. Combined forms of age-related cataract of left eye  H25.812     5. Pseudophakia  Z96.1      1. Epiretinal membrane, OU (OD>OS)  - pre-op BCVA OD: 20/50; OS: 20/20 (MRx) - pt reported generalized blur, mild metamorphopsia OD - OS stable --Mild ERM with stable thickening of temporal fovea; foveal contour remains good, persistent pucker--slightly increased, partial PVD -- VA remains 20/20 OS  - s/p PPV/TissueBlue /MP/20% SF6 OD, 11.18.21 -- stable improvement in ERM             - doing well -- BCVA OD 20/20 post cataract surgery              - IOP 15 - pt is cleared from a retina standpoint for release to Dr. Thom Hamilton and resumption of primary eye  care  2,3. Hypertensive retinopathy OU - discussed importance of tight BP control - monitor   4. Mixed Cataract OS - The symptoms of cataract, surgical options, and treatments and risks were discussed with patient. - discussed diagnosis and progression - monitor   5. Pseudophakia OD  - s/p CE/IOL OD (Dr. Lelon, 07.06.22)  - IOL in good position, doing well  - monitor   Ophthalmic Meds Ordered this visit:  No orders of the defined types  were placed in this encounter.    Return if symptoms worsen or fail to improve.  There are no Patient Instructions on file for this visit.  This document serves as a record of services personally performed by Redell JUDITHANN Hans, MD, PhD. It was created on their behalf by Delon Newness COT, an ophthalmic technician. The creation of this record is the provider's dictation and/or activities during the visit.    Electronically signed by: Delon Newness COT 07.15.2025 4:01 PM  This document serves as a record of services personally performed by Redell JUDITHANN Hans, MD, PhD. It was created on their behalf by Alan PARAS. Delores, OA an ophthalmic technician. The creation of this record is the provider's dictation and/or activities during the visit.    Electronically signed by: Alan PARAS. Delores, OA 11/28/23 4:01 PM  Redell JUDITHANN Hans, M.D., Ph.D. Diseases & Surgery of the Retina and Vitreous [facility] 11/26/2023   I have reviewed the above documentation for accuracy and completeness, and I agree with the above. Redell JUDITHANN Hans, M.D., Ph.D. 11/28/23 4:02 PM    Abbreviations: M myopia (nearsighted); A astigmatism; H hyperopia (farsighted); P presbyopia; Mrx spectacle prescription;  CTL contact lenses; OD right eye; OS left eye; OU both eyes  XT exotropia; ET esotropia; PEK punctate epithelial keratitis; PEE punctate epithelial erosions; DES dry eye syndrome; MGD meibomian gland dysfunction; ATs artificial tears; PFAT's preservative free artificial tears; NSC nuclear sclerotic cataract; PSC posterior subcapsular cataract; ERM epi-retinal membrane; PVD posterior vitreous detachment; RD retinal detachment; DM diabetes mellitus; DR diabetic retinopathy; NPDR non-proliferative diabetic retinopathy; PDR proliferative diabetic retinopathy; CSME clinically significant macular edema; DME diabetic macular edema; dbh dot blot hemorrhages; CWS cotton wool spot; POAG primary open angle  glaucoma; C/D cup-to-disc ratio; HVF humphrey visual field; GVF goldmann visual field; OCT optical coherence tomography; IOP intraocular pressure; BRVO Branch retinal vein occlusion; CRVO central retinal vein occlusion; CRAO central retinal artery occlusion; BRAO branch retinal artery occlusion; RT retinal tear; SB scleral buckle; PPV pars plana vitrectomy; VH Vitreous hemorrhage; PRP panretinal laser photocoagulation; IVK intravitreal kenalog ; VMT vitreomacular traction; MH Macular hole;  NVD neovascularization of the disc; NVE neovascularization elsewhere; AREDS age related eye disease study; ARMD age related macular degeneration; POAG primary open angle glaucoma; EBMD epithelial/anterior basement membrane dystrophy; ACIOL anterior chamber intraocular lens; IOL intraocular lens; PCIOL posterior chamber intraocular lens; Phaco/IOL phacoemulsification with intraocular lens placement; PRK photorefractive keratectomy; LASIK laser assisted in situ keratomileusis; HTN hypertension; DM diabetes mellitus; COPD chronic obstructive pulmonary disease

## 2023-11-26 ENCOUNTER — Ambulatory Visit (INDEPENDENT_AMBULATORY_CARE_PROVIDER_SITE_OTHER): Payer: Medicare Other | Admitting: Ophthalmology

## 2023-11-26 ENCOUNTER — Encounter (INDEPENDENT_AMBULATORY_CARE_PROVIDER_SITE_OTHER): Payer: Self-pay | Admitting: Ophthalmology

## 2023-11-26 DIAGNOSIS — Z961 Presence of intraocular lens: Secondary | ICD-10-CM

## 2023-11-26 DIAGNOSIS — H35033 Hypertensive retinopathy, bilateral: Secondary | ICD-10-CM | POA: Diagnosis not present

## 2023-11-26 DIAGNOSIS — H35373 Puckering of macula, bilateral: Secondary | ICD-10-CM | POA: Diagnosis not present

## 2023-11-26 DIAGNOSIS — I10 Essential (primary) hypertension: Secondary | ICD-10-CM

## 2023-11-26 DIAGNOSIS — H25812 Combined forms of age-related cataract, left eye: Secondary | ICD-10-CM | POA: Diagnosis not present

## 2023-11-28 ENCOUNTER — Encounter (INDEPENDENT_AMBULATORY_CARE_PROVIDER_SITE_OTHER): Payer: Self-pay | Admitting: Ophthalmology

## 2024-02-23 ENCOUNTER — Other Ambulatory Visit: Payer: Self-pay | Admitting: Family Medicine

## 2024-02-23 DIAGNOSIS — Z1231 Encounter for screening mammogram for malignant neoplasm of breast: Secondary | ICD-10-CM

## 2024-03-23 ENCOUNTER — Ambulatory Visit
Admission: RE | Admit: 2024-03-23 | Discharge: 2024-03-23 | Disposition: A | Discharge status: Home / Self Care | Source: Ambulatory Visit | Attending: Family Medicine | Admitting: Family Medicine

## 2024-03-23 DIAGNOSIS — Z1231 Encounter for screening mammogram for malignant neoplasm of breast: Secondary | ICD-10-CM

## 2024-03-27 ENCOUNTER — Other Ambulatory Visit: Payer: Self-pay | Admitting: Family Medicine

## 2024-03-27 DIAGNOSIS — R928 Other abnormal and inconclusive findings on diagnostic imaging of breast: Secondary | ICD-10-CM

## 2024-04-10 ENCOUNTER — Ambulatory Visit
Admission: RE | Admit: 2024-04-10 | Discharge: 2024-04-10 | Disposition: A | Source: Ambulatory Visit | Attending: Family Medicine | Admitting: Family Medicine

## 2024-04-10 DIAGNOSIS — R928 Other abnormal and inconclusive findings on diagnostic imaging of breast: Secondary | ICD-10-CM
